# Patient Record
Sex: Female | Born: 1975 | Race: Black or African American | Hispanic: No | Marital: Single | Smoking: Never smoker
Health system: Southern US, Community
[De-identification: ages and names within clinical notes are randomized; demographics above are authoritative.]

## PROBLEM LIST (undated history)

## (undated) DIAGNOSIS — D219 Benign neoplasm of connective and other soft tissue, unspecified: Secondary | ICD-10-CM

## (undated) DIAGNOSIS — Z8619 Personal history of other infectious and parasitic diseases: Secondary | ICD-10-CM

## (undated) DIAGNOSIS — IMO0002 Reserved for concepts with insufficient information to code with codable children: Secondary | ICD-10-CM

## (undated) DIAGNOSIS — G932 Benign intracranial hypertension: Secondary | ICD-10-CM

## (undated) DIAGNOSIS — N959 Unspecified menopausal and perimenopausal disorder: Secondary | ICD-10-CM

## (undated) DIAGNOSIS — R87619 Unspecified abnormal cytological findings in specimens from cervix uteri: Secondary | ICD-10-CM

## (undated) DIAGNOSIS — K219 Gastro-esophageal reflux disease without esophagitis: Secondary | ICD-10-CM

## (undated) DIAGNOSIS — R55 Syncope and collapse: Secondary | ICD-10-CM

## (undated) DIAGNOSIS — D649 Anemia, unspecified: Secondary | ICD-10-CM

## (undated) HISTORY — DX: Unspecified menopausal and perimenopausal disorder: N95.9

## (undated) HISTORY — DX: Unspecified abnormal cytological findings in specimens from cervix uteri: R87.619

## (undated) HISTORY — DX: Personal history of other infectious and parasitic diseases: Z86.19

## (undated) HISTORY — DX: Syncope and collapse: R55

## (undated) HISTORY — DX: Reserved for concepts with insufficient information to code with codable children: IMO0002

## (undated) HISTORY — PX: TONSILLECTOMY: SUR1361

---

## 2001-10-14 ENCOUNTER — Other Ambulatory Visit: Admission: RE | Admit: 2001-10-14 | Discharge: 2001-10-14 | Payer: Self-pay | Admitting: Obstetrics and Gynecology

## 2002-02-18 HISTORY — PX: NASAL SEPTUM SURGERY: SHX37

## 2002-02-18 HISTORY — PX: TURBINATE RESECTION: SHX293

## 2002-10-21 ENCOUNTER — Other Ambulatory Visit: Admission: RE | Admit: 2002-10-21 | Discharge: 2002-10-21 | Payer: Self-pay | Admitting: Obstetrics and Gynecology

## 2003-02-19 HISTORY — PX: DILATION AND CURETTAGE OF UTERUS: SHX78

## 2004-04-29 ENCOUNTER — Ambulatory Visit: Payer: Self-pay | Admitting: Internal Medicine

## 2004-04-29 ENCOUNTER — Ambulatory Visit (HOSPITAL_BASED_OUTPATIENT_CLINIC_OR_DEPARTMENT_OTHER): Admission: RE | Admit: 2004-04-29 | Discharge: 2004-04-29 | Payer: Self-pay | Admitting: Otolaryngology

## 2004-06-20 ENCOUNTER — Ambulatory Visit (HOSPITAL_COMMUNITY): Admission: RE | Admit: 2004-06-20 | Discharge: 2004-06-21 | Payer: Self-pay | Admitting: Otolaryngology

## 2004-06-20 ENCOUNTER — Encounter (INDEPENDENT_AMBULATORY_CARE_PROVIDER_SITE_OTHER): Payer: Self-pay | Admitting: Specialist

## 2004-08-25 ENCOUNTER — Emergency Department (HOSPITAL_COMMUNITY): Admission: EM | Admit: 2004-08-25 | Discharge: 2004-08-25 | Payer: Self-pay | Admitting: Emergency Medicine

## 2004-09-16 ENCOUNTER — Emergency Department (HOSPITAL_COMMUNITY): Admission: EM | Admit: 2004-09-16 | Discharge: 2004-09-16 | Payer: Self-pay | Admitting: Family Medicine

## 2006-04-05 ENCOUNTER — Emergency Department (HOSPITAL_COMMUNITY): Admission: EM | Admit: 2006-04-05 | Discharge: 2006-04-05 | Payer: Self-pay | Admitting: Family Medicine

## 2006-08-13 ENCOUNTER — Emergency Department (HOSPITAL_COMMUNITY): Admission: EM | Admit: 2006-08-13 | Discharge: 2006-08-13 | Payer: Self-pay | Admitting: Emergency Medicine

## 2007-01-11 ENCOUNTER — Emergency Department (HOSPITAL_COMMUNITY): Admission: EM | Admit: 2007-01-11 | Discharge: 2007-01-11 | Payer: Self-pay | Admitting: Family Medicine

## 2007-09-02 ENCOUNTER — Emergency Department (HOSPITAL_COMMUNITY): Admission: EM | Admit: 2007-09-02 | Discharge: 2007-09-02 | Payer: Self-pay | Admitting: Emergency Medicine

## 2007-09-11 ENCOUNTER — Ambulatory Visit: Payer: Self-pay | Admitting: Internal Medicine

## 2007-09-11 DIAGNOSIS — R030 Elevated blood-pressure reading, without diagnosis of hypertension: Secondary | ICD-10-CM | POA: Insufficient documentation

## 2007-09-11 DIAGNOSIS — E282 Polycystic ovarian syndrome: Secondary | ICD-10-CM | POA: Insufficient documentation

## 2007-09-14 LAB — CONVERTED CEMR LAB
ALT: 16 units/L (ref 0–35)
Alkaline Phosphatase: 51 units/L (ref 39–117)
BUN: 12 mg/dL (ref 6–23)
Bilirubin, Direct: 0.1 mg/dL (ref 0.0–0.3)
Cholesterol: 146 mg/dL (ref 0–200)
Creatinine, Ser: 0.9 mg/dL (ref 0.4–1.2)
Eosinophils Absolute: 0.2 10*3/uL (ref 0.0–0.7)
Eosinophils Relative: 3.3 % (ref 0.0–5.0)
GFR calc non Af Amer: 78 mL/min
LDL Cholesterol: 93 mg/dL (ref 0–99)
Lymphocytes Relative: 40.1 % (ref 12.0–46.0)
MCHC: 32.1 g/dL (ref 30.0–36.0)
Monocytes Absolute: 0.4 10*3/uL (ref 0.1–1.0)
Monocytes Relative: 6.4 % (ref 3.0–12.0)
Platelets: 330 10*3/uL (ref 150–400)
Total Bilirubin: 0.6 mg/dL (ref 0.3–1.2)
Total CHOL/HDL Ratio: 3.8
Total Protein: 7.3 g/dL (ref 6.0–8.3)
VLDL: 15 mg/dL (ref 0–40)
WBC: 6.2 10*3/uL (ref 4.5–10.5)

## 2007-11-13 ENCOUNTER — Ambulatory Visit: Payer: Self-pay | Admitting: Internal Medicine

## 2007-11-13 DIAGNOSIS — N949 Unspecified condition associated with female genital organs and menstrual cycle: Secondary | ICD-10-CM

## 2007-11-13 DIAGNOSIS — R112 Nausea with vomiting, unspecified: Secondary | ICD-10-CM | POA: Insufficient documentation

## 2007-11-13 DIAGNOSIS — N938 Other specified abnormal uterine and vaginal bleeding: Secondary | ICD-10-CM | POA: Insufficient documentation

## 2007-11-13 DIAGNOSIS — N925 Other specified irregular menstruation: Secondary | ICD-10-CM | POA: Insufficient documentation

## 2007-11-13 LAB — CONVERTED CEMR LAB
Beta hcg, urine, semiquantitative: NEGATIVE
Ketones, urine, test strip: NEGATIVE
Nitrite: NEGATIVE
Specific Gravity, Urine: 1.015
WBC Urine, dipstick: NEGATIVE
pH: 6

## 2007-11-19 ENCOUNTER — Encounter: Admission: RE | Admit: 2007-11-19 | Discharge: 2007-11-19 | Payer: Self-pay | Admitting: Internal Medicine

## 2007-12-10 ENCOUNTER — Ambulatory Visit: Payer: Self-pay | Admitting: Internal Medicine

## 2008-02-19 ENCOUNTER — Emergency Department (HOSPITAL_COMMUNITY): Admission: EM | Admit: 2008-02-19 | Discharge: 2008-02-19 | Payer: Self-pay | Admitting: Family Medicine

## 2008-07-19 ENCOUNTER — Emergency Department (HOSPITAL_COMMUNITY): Admission: EM | Admit: 2008-07-19 | Discharge: 2008-07-19 | Payer: Self-pay | Admitting: Family Medicine

## 2008-10-07 ENCOUNTER — Ambulatory Visit: Payer: Self-pay | Admitting: Internal Medicine

## 2008-10-07 DIAGNOSIS — N926 Irregular menstruation, unspecified: Secondary | ICD-10-CM | POA: Insufficient documentation

## 2008-10-19 ENCOUNTER — Encounter: Payer: Self-pay | Admitting: Internal Medicine

## 2009-01-03 ENCOUNTER — Encounter (INDEPENDENT_AMBULATORY_CARE_PROVIDER_SITE_OTHER): Payer: Self-pay | Admitting: Obstetrics and Gynecology

## 2009-01-03 ENCOUNTER — Ambulatory Visit (HOSPITAL_COMMUNITY): Admission: RE | Admit: 2009-01-03 | Discharge: 2009-01-03 | Payer: Self-pay | Admitting: Obstetrics and Gynecology

## 2009-07-02 ENCOUNTER — Emergency Department (HOSPITAL_COMMUNITY): Admission: EM | Admit: 2009-07-02 | Discharge: 2009-07-02 | Payer: Self-pay | Admitting: Emergency Medicine

## 2010-05-23 LAB — CBC
Hemoglobin: 12.2 g/dL (ref 12.0–15.0)
MCHC: 31 g/dL (ref 30.0–36.0)
MCV: 71.4 fL — ABNORMAL LOW (ref 78.0–100.0)
Platelets: 320 10*3/uL (ref 150–400)
RBC: 5.49 MIL/uL — ABNORMAL HIGH (ref 3.87–5.11)
WBC: 6.8 10*3/uL (ref 4.0–10.5)

## 2010-05-23 LAB — PREGNANCY, URINE: Preg Test, Ur: NEGATIVE

## 2010-07-06 NOTE — H&P (Signed)
Kristin Harper, Kristin Harper              ACCOUNT NO.:  [id number]   MEDICAL RECORD NO.:  [id number]          PATIENT TYPE:  OIB   LOCATION:  2550                         FACILITY:  [facility]   PHYSICIAN:  Hermelinda Medicus, M.D.   DATE OF BIRTH:  1975-02-20   DATE OF ADMISSION:  06/20/2004  DATE OF DISCHARGE:                                HISTORY & PHYSICAL   This. Patient is a 35 year old female who has had difficulty breathing  through her nose.  She has severe nasal obstruction with turbinate  hypertrophy and a septal deviation.  She has also not had her tonsils out.  She has sleep apnea, snores and obstructs, and has frightened her family and  friends when she is sleeping.  Her ENT examination is quite unremarkable.  Her larynx is clear, except she does have a very small mouth and large  tonsils.  She also is overweight at 245.  She had a sleep study which showed  her deep sleep to be only 14% of the time.  More normal is 30%.  She has  severe obstructive sleep apnea/hypopnea syndrome, with an RDI of 54.9, and  an O2 saturation nadir of 77%.  She could consider CPAP, but she has such a  severe septal deviation and nasal obstruction so there would be highly  unlikely a positive result.  She therefore enters for a septal  reconstruction, turbinate reduction, a uvulopalatoplasty, and a  tonsillectomy to gain some more space within her nose and also within her  oral cavity.   PAST HISTORY:  1.  Allergy to PENICILLIN, rash.  2.  She does not smoke.  She does drink socially occasionally.  3.  She takes no medications.  4.  Has never had surgery.  5.  Has some borderline hypertension, but her blood pressure now is in good      condition at 135/86.  6.  She has contact lenses.  7.  She does have irregular periods.   PHYSICAL EXAMINATION:  GENERAL:  A 35 year old female weighing 268.  She is  66 inches tall.  VITAL SIGNS:  Blood pressure 135/86; pulse 88; temperature 99.7.  HEENT:  Ears are  clear.  Tympanic membrane are clear.  the nose shows very  strong, very large turbinates, and a septal deviation that is off to the  left primarily.  The oral cavity is very small.  The tonsils are 3+ in size.  The larynx was clear.  True cords, false cods, epiglottis, base of tongue  are clear of any ulceration or mass or abnormalities.  Larynx, true cord  mobility, gag reflex, __________, EOMs, facial nerve were all symmetrical.  NECK:  Free of any thyromegaly, cervical adenopathy, or mass, but is quite  full because of her increased weight.  CHEST:  Clear.  No rales, rhonchi, or wheezes.  CARDIOVASCULAR:  No murmurs, rubs, or gallops.  ABDOMEN:  Unremarkable.  Mildly obese.  EXTREMITIES:  Unremarkable.   INITIAL DIAGNOSES:  1.  Sleep apnea, with history of obesity.  2.  Septal deviation.  3.  Turbinate hypertrophy.      JC/MEDQ  D:  06/20/2004  T:  06/20/2004  Job:  [id number]   cc:   Fayrene Fearing A. Ashley Royalty, M.D.  [address]  [place], [place] [address]  Fax: [phone]

## 2010-07-06 NOTE — Procedures (Signed)
NAMEMELISSAANN, DIZDAREVIC              ACCOUNT NO.:  [id number]   MEDICAL RECORD NO.:  [id number]          PATIENT TYPE:  OUT   LOCATION:  SLEEP CENTER                 FACILITY:  [facility]   PHYSICIAN:  Clinton D. Maple Hudson, M.D. DATE OF BIRTH:  12-22-1975   DATE OF STUDY:  04/29/2004                              NOCTURNAL POLYSOMNOGRAM   REFERRING PHYSICIAN:  Dr. Hermelinda Medicus.   INDICATIONS FOR STUDY:  Hypersomnia with sleep apnea. Epworth sleepiness  score 10/24, BMI 39, weight 245 pounds.   SLEEP ARCHITECTURE:  Total sleep time 382 minutes with sleep efficiency of  95%. Stage I was 3%, stage II was 69%, stages III and IV were 15%, REM was  14% of total sleep time. Sleep latency was 5 minutes. REM latency 103  minutes. Awake after sleep onset 15 minutes. Arousal index 6.7. No sleep  medications were recorded.   RESPIRATORY DATA:  NPSG protocol. Respiratory disturbance index (RDI) 54.9  obstructive events per hour indicating severe obstructive sleep  apnea/hypopnea syndrome. There was 1 central apnea with 208 obstructive  apneas and 141 hypopneas. Events were not positional. REM RDI was 114.   OXYGEN DATA:  Moderate snoring with oxygen desaturation to a nadir of 77%.  Mean oxygen saturation through the study was 96% on room air.   CARDIAC DATA:  Normal sinus rhythm.   MOVEMENT/PARASOMNIA:  Occasional leg jerk.   IMPRESSION/RECOMMENDATIONS:  1.  Severe obstructive sleep apnea/hypopnea syndrome, RDI of 54.9 per hour      with moderate snoring and oxygen desaturation to a nadir of 77%.  2.  Consider CPAP titration or evaluate for alternative therapies as      appropriate.      CDY/MEDQ  D:  05/06/2004 14:16:03  T:  05/07/2004 11:46:24  Job:  [id number]

## 2010-07-06 NOTE — Op Note (Signed)
Kristin Harper, Kristin Harper              ACCOUNT NO.:  [id number]   MEDICAL RECORD NO.:  [id number]          PATIENT TYPE:  OIB   LOCATION:  2550                         FACILITY:  [facility]   PHYSICIAN:  Hermelinda Medicus, M.D.   DATE OF BIRTH:  05-14-1975   DATE OF PROCEDURE:  06/20/2004  DATE OF DISCHARGE:                                 OPERATIVE REPORT   PREOPERATIVE DIAGNOSES:  Sleep apnea with septal deviation and turbinate  hypertrophy with tonsillar hypertrophy with obesity, weighing 268.   POSTOPERATIVE DIAGNOSES:  Sleep apnea with septal deviation and turbinate  hypertrophy with tonsillar hypertrophy with obesity, weighing 268.   OPERATION:  Septal reconstruction, turbinate reduction with tonsillectomy  and uvulopalatoplasty.   SURGEON:  Hermelinda Medicus, M.D.   ANESTHESIA:  General endotracheal with Dr. Noreene Larsson.   DESCRIPTION OF PROCEDURE:  The patient was placed in the supine position and  under general orotracheal anesthesia, the patient was prepped and draped in  the appropriate manner using the usual head drape and the nose was  anesthetized using 1% Xylocaine with epinephrine and topical cocaine 200 mg.  The inferior turbinates were aggressively outfractured and also the  __________ bipolar cautery was used to shrink the mucous membranes of these  very large obstructive turbinates.  The septum was then approached using a  hemitransfixion incision on the right, carrying it around the columella to  the left and then back along the quadrilateral cartilage and ethmoid septum  and this was taken down using the [place] and then the [place] and opening  and close [employer] forceps.  Once this was brought down, then the  boomerang septal deviation was also corrected using the 4-mm chisel.  Hemostasis was established and then closure of the septum was with 5-0 plain  catgut and through-and-through septal suture as a blanket stitch using 4-0  x2.  Telfa was used on a temporary  basis in the nose.   Attention was then carried to the oral cavity where the tonsillar Davis  mouth gag was placed.  The tonsils were found to be large and were removed  using blunt dissection and Bovie electrocoagulation for hemostasis.  Once  the tonsils were removed, the uvula was trimmed which was also twice as  large as the normal size and the palate was also trimmed to make it a little  bit higher on the contour in this very small mouth.  Once this was completed  and all hemostasis was again checked, the stomach was suctioned.  The gag  was slowly released.  The tonsillar beds were checked for any lack of  hemostasis and found to be completely dry.  The nasopharynx and nose were  suctioned and  then the Telfa was removed and replaced with anesthesia trumpets to  guarantee immediate postop airway.  The patient was taken to the recovery  room in good condition.   Followup will be in step-down 3300 and then in 1 week, 2 weeks, 3 weeks,  then 6 weeks, 3 months, and 6 months.      JC/MEDQ  D:  06/20/2004  T:  06/20/2004  Job:  [id number]   cc:   Rudy Jew. Ashley Royalty, M.D.  [address]  [place], [place] [address]  Fax: [phone]

## 2010-09-21 ENCOUNTER — Inpatient Hospital Stay (INDEPENDENT_AMBULATORY_CARE_PROVIDER_SITE_OTHER)
Admission: RE | Admit: 2010-09-21 | Discharge: 2010-09-21 | Disposition: A | Discharge status: Home / Self Care | Payer: BC Managed Care – PPO | Source: Ambulatory Visit | Attending: Emergency Medicine | Admitting: Emergency Medicine

## 2010-09-21 DIAGNOSIS — J019 Acute sinusitis, unspecified: Secondary | ICD-10-CM

## 2010-09-21 DIAGNOSIS — J40 Bronchitis, not specified as acute or chronic: Secondary | ICD-10-CM

## 2011-05-06 ENCOUNTER — Emergency Department (HOSPITAL_COMMUNITY)
Admission: EM | Admit: 2011-05-06 | Discharge: 2011-05-06 | Disposition: A | Discharge status: Home / Self Care | Payer: BC Managed Care – PPO | Source: Home / Self Care | Attending: Emergency Medicine | Admitting: Emergency Medicine

## 2011-05-06 ENCOUNTER — Encounter (HOSPITAL_COMMUNITY): Payer: Self-pay

## 2011-05-06 DIAGNOSIS — R51 Headache: Secondary | ICD-10-CM

## 2011-05-06 DIAGNOSIS — R519 Headache, unspecified: Secondary | ICD-10-CM

## 2011-05-06 MED ORDER — AZITHROMYCIN 250 MG PO TABS: 250.0000 mg | ORAL_TABLET | Freq: Every day | ORAL | Status: AC

## 2011-05-06 MED ORDER — CETIRIZINE-PSEUDOEPHEDRINE ER 5-120 MG PO TB12: 1.0000 | ORAL_TABLET | Freq: Every day | ORAL | Status: DC

## 2011-05-06 NOTE — Discharge Instructions (Signed)
As discussed start with the congestion along with Zyrtec and if no improvement within the next 2-3 days or more times pressures perceive start with provided antibiotic. Resting well hydrated and use a humidifier as discussed the believe your symptoms are related to sinus congestion but he would be unlikely at this point to be a  bacterial process

## 2011-05-06 NOTE — ED Provider Notes (Signed)
History     CSN: [id number]  Arrival date & time 05/06/11  1941   First MD Initiated Contact with Patient 05/06/11 1952      Chief Complaint  Patient presents with  . Facial Pain    (Consider location/radiation/quality/duration/timing/severity/associated sxs/prior treatment) HPI Comments: Patient presents with a headache (points to both frontal and maxillary areas). Pressure type sensation exacerbates with leaning forward also gets a little bit dizzy she moves her head. Described that during which she might have been a bit congestion of her nose and she was also having some diarrhea and vomiting which have resolved at this point. Patient denies any discomfort or pain any fevers. Have been taking Motrin with significant relief to the point that this moment she barely perceives any pressure.  Patient denies any numbness, weakness, visual disturbances  Patient is a 36 y.o. female presenting with headaches.  Headache The primary symptoms include headaches, dizziness, nausea and vomiting. Primary symptoms do not include loss of consciousness, altered mental status, visual change, focal weakness, loss of sensation or fever. The symptoms began 2 days ago. The symptoms are unchanged. The neurological symptoms are multifocal.  The headache is not associated with photophobia, visual change, neck stiffness or weakness.  Dizziness also occurs with nausea and vomiting. Dizziness does not occur with weakness.  Additional symptoms do not include neck stiffness, weakness, photophobia or irritability.    History reviewed. No pertinent past medical history.  History reviewed. No pertinent past surgical history.  History reviewed. No pertinent family history.  History  Substance Use Topics  . Smoking status: Not on file  . Smokeless tobacco: Not on file  . Alcohol Use: Not on file    OB History    Grav Para Term Preterm Abortions TAB SAB Ect Mult Living                  Review of Systems    Constitutional: Positive for chills. Negative for fever, activity change and irritability.  HENT: Positive for congestion and postnasal drip. Negative for neck pain and neck stiffness.   Eyes: Negative for photophobia.  Gastrointestinal: Positive for nausea, vomiting and diarrhea. Negative for abdominal pain.  Skin: Positive for color change.  Neurological: Positive for dizziness and headaches. Negative for tremors, focal weakness, loss of consciousness, speech difficulty, weakness, light-headedness and numbness.  Psychiatric/Behavioral: Negative for altered mental status.    Allergies  Penicillins  Home Medications   Current Outpatient Rx  Name Route Sig Dispense Refill  . CETIRIZINE HCL 10 MG PO TABS Oral Take 10 mg by mouth daily.    . AZITHROMYCIN 250 MG PO TABS Oral Take 1 tablet (250 mg total) by mouth daily. Take first 2 tablets together, then 1 every day until finished. 6 tablet 0  . CETIRIZINE-PSEUDOEPHEDRINE ER 5-120 MG PO TB12 Oral Take 1 tablet by mouth daily. 10 tablet 0    BP 132/91  Pulse 78  Temp(Src) 99.4 F (37.4 C) (Oral)  Resp 16  SpO2 99%  LMP 04/08/2011  Physical Exam  Nursing note and vitals reviewed. Constitutional: She appears well-developed and well-nourished.  HENT:  Head: Normocephalic.  Right Ear: Tympanic membrane normal.  Left Ear: Tympanic membrane normal.  Mouth/Throat: Uvula is midline, oropharynx is clear and moist and mucous membranes are normal. No oropharyngeal exudate.  Eyes: Conjunctivae are normal.  Neck: Neck supple. No JVD present.  Cardiovascular: Normal rate.  Exam reveals no gallop.   No murmur heard. Pulmonary/Chest: Effort normal and breath sounds  normal. No respiratory distress. She has no decreased breath sounds. She has no rhonchi. She has no rales.  Abdominal: Bowel sounds are normal. She exhibits no distension. There is no tenderness. There is no rebound.  Lymphadenopathy:    She has no cervical adenopathy.  Skin:  Skin is warm. No erythema.    ED Course  Procedures (including critical care time)  Labs Reviewed - No data to display No results found.   1. Sinus headache       MDM  Sinus congestion with resolving gastrointestinal symptoms and her symptomatic management for the next 2-3 days patient agree with treatment plan        Jimmie Molly, MD 05/06/11 2052

## 2011-05-06 NOTE — ED Notes (Signed)
C/o pressure in face, feels off kilter; concerned about poss sinus infection

## 2011-07-13 ENCOUNTER — Inpatient Hospital Stay (HOSPITAL_COMMUNITY)
Admission: AD | Admit: 2011-07-13 | Discharge: 2011-07-13 | Disposition: A | Discharge status: Home / Self Care | Payer: BC Managed Care – PPO | Source: Ambulatory Visit | Attending: Obstetrics & Gynecology | Admitting: Obstetrics & Gynecology

## 2011-07-13 ENCOUNTER — Encounter (HOSPITAL_COMMUNITY): Payer: Self-pay | Admitting: Obstetrics and Gynecology

## 2011-07-13 DIAGNOSIS — Z711 Person with feared health complaint in whom no diagnosis is made: Secondary | ICD-10-CM | POA: Insufficient documentation

## 2011-07-13 DIAGNOSIS — Z01419 Encounter for gynecological examination (general) (routine) without abnormal findings: Secondary | ICD-10-CM

## 2011-07-13 NOTE — MAU Provider Note (Signed)
Kristin Harper [id number]  No chief complaint on file.    First Provider Initiated Contact with Patient 07/13/11 1851      SUBJECTIVE  HPI: Pt presents during her menses with retained tampon.  She placed a light flow tampon this morning and a few hours later could not find the string.  She tried searching for the tampon herself but could not find it, then went to Urgent care and had a pelvic exam.  No tampon was found by Urgent care provider so pt sent to MAU for eval. She denies vaginal itching/burning, urinary symptoms, h/a, dizziness, n/v, or fever/chills.     History reviewed. No pertinent past medical history. Past Surgical History  Procedure Date  . Dilation and curettage of uterus 2005    In [place]  . Tonsillectomy   . Turbinate resection 2004  . Nasal septum surgery 2004   History   Social History  . Marital Status: Single    Spouse Name: N/A    Number of Children: N/A  . Years of Education: N/A   Occupational History  . Not on file.   Social History Main Topics  . Smoking status: Never Smoker   . Smokeless tobacco: Not on file  . Alcohol Use: Yes     socially  . Drug Use: No  . Sexually Active: Yes    Birth Control/ Protection: Condom   Other Topics Concern  . Not on file   Social History Narrative  . No narrative on file   No current facility-administered medications on file prior to encounter.   Current Outpatient Prescriptions on File Prior to Encounter  Medication Sig Dispense Refill  . cetirizine (ZYRTEC) 10 MG tablet Take 10 mg by mouth daily.      . cetirizine-pseudoephedrine (ZYRTEC-D) 5-120 MG per tablet Take 1 tablet by mouth daily.  10 tablet  0   Allergies  Allergen Reactions  . Penicillins Hives and Rash    ROS: Pertinent items in HPI  OBJECTIVE Blood pressure 139/95, pulse 103, temperature 97.5 F (36.4 C), temperature source Oral, resp. rate 18, height 5\' 7"  (1.702 m), weight 133.993 kg (295 lb 6.4 oz), last menstrual period  07/08/2011.  GENERAL: Well-developed, well-nourished female in no acute distress.  HEENT: Normocephalic, good dentition HEART: normal rate RESP: normal effort ABDOMEN: Soft, nontender EXTREMITIES: Nontender, no edema NEURO: Alert and oriented Pelvic exam: Cervix pink, visually closed, without lesion, small amount dark red blood from cervical os and in vaginal vault, Tampon not visualized, vaginal walls and external genitalia normal Bimanual exam: Cervix 0/long/high, firm, posterior, neg CMT, No tampon palpable in vagina  ASSESSMENT Normal pelvic exam No retained tampon  PLAN D/C home Reassurance provided that tampon is not in vagina and must have come out during bowel movement or some other time At pt request, discussed signs of infection for retained tampon and reasons pt should return Return to MAU as needed     LEFTWICH-KIRBY, Zarius Furr 07/13/2011 7:01 PM

## 2011-07-13 NOTE — Discharge Instructions (Signed)
No tampon was found retained in the vagina during pelvic exam today.  However, if you have signs of infection, including fever/chills, nausea or vomiting, rash, severe h/a, etc, please return to MAU.    Menstruation Menstruation is the monthly passing of blood, tissue, fluid and mucus, also know as a period. Your body is shedding the lining of the uterus. The flow, or amount of blood, usually lasts from 3 to 7 days each month. Hormones control the menstrual cycle. Hormones are a chemical substance produced by endocrine glands in the body to regulate different bodily functions. The first menstrual period may start any time between age 32 to 70 years. However, it usually starts around age 91 or 65. Some girls have regular monthly menstrual cycles right from the beginning. However, it is not unusual to have only a couple of drops of blood or spotting when you first start menstruating. It is also not unusual to have two periods a month or miss a month or two when first starting your periods. SYMPTOMS   Mild to moderate abdominal cramps.   Aching or pain in the lower back area.  Symptoms that may occur 5 to 10 days before your menstrual period starts, which is referred to as premenstrual syndrome (PMS). These symptoms can include:  Headache.   Breast tenderness and swelling.   Bloating.   Tiredness (fatigue).   Mood changes.   Craving for certain foods.  These are normal signs and symptoms and can vary in severity. To help relieve these problems, ask your caregiver if you can take over-the-counter medications for pain or discomfort. If the symptoms are not controllable, see your caregiver for help.  HORMONES INVOLVED IN MENSTRUATION Menstruation comes about because of hormones produced by the pituitary gland in the brain and the ovaries that affect the uterine lining. First, the pituitary gland in the brain produces the hormone Follicle Stimulating Hormone [facility]). FSH stimulates the ovaries to  produce estrogen, which thickens the uterine lining and begins to develop an egg in the ovary. About 14 days later, the pituitary gland produces another hormone called Luteinizing Hormone (LH). LH causes the egg to come out of a sac in the ovary (ovulation). The empty sac on the ovary called the corpus luteum is stimulated by another hormone from the pituitary gland called luteotropin. The corpus luteum begins to produce the estrogen and progesterone hormone. The progesterone hormone prepares the lining of the uterus to have the fertilized egg (egg and sperm) attach to the lining of the uterus and begin to develop into a fetus. If the egg is not fertilized, the corpus luteum stops producing estrogen and progesterone, it disappears, the lining of the uterus sloughs off and a menstrual period begins. Then the menstrual cycle starts all over again and will continue monthly unless pregnancy occurs or menopause begins. The secretion of hormones is complex. Various parts of the body become involved in many chemical activities. Female sex hormones have other functions in a woman's body as well. Estrogen increases a woman's sex drive (libido). It naturally helps body get rid of fluids (diuretic). It also aids in the process of building new bone. Therefore, maintaining hormonal health is essential to all levels of a woman's well being. These hormones are usually present in normal amounts and cause you to menstruate. It is the relationship between the (small) levels of the hormones that is critical. When the balance is upset, menstrual irregularities can occur. HOW DOES THE MENSTRUAL CYCLE HAPPEN?  Menstrual cycles  vary in length from 21 to 35 days with an average of 29 days. The cycle begins on the first day of bleeding. At this time, the pituitary gland in the brain releases FSH that travels through the bloodstream to the ovaries. [facility] stimulates the follicles in the ovaries. This prepares the body for ovulation that  occurs around the 14th day of the cycle. The ovaries produce estrogen, and this makes sure conditions are right in the uterus for implantation of the fertilized egg.   When the levels of estrogen reach a high enough level, it signals the gland in the brain (pituitary gland) to release a surge of LH. This causes the release of the ripest egg from its follicle (ovulation). Usually only one follicle releases one egg, but sometimes more than one follicle releases an egg especially when stimulating the ovaries for invitro fertilization. The egg can then be collected by either fallopian tube to await fertilization. The burst follicle within the ovary that is left behind is now called the corpus luteum or "yellow body." The corpus luteum continues to give off (secrete) reduced amounts of estrogen. This closes and hardens the cervix. It driesup the mucus to the naturally infertile condition.   The corpus luteum also begins to give off greater amounts of progesterone. This causes the lining of the uterus (endometrium) to thicken even more in preparation for the fertilized egg. The egg is starting to journey down from the fallopian tube to the uterus. It also signals the ovaries to stop releasing eggs. It assists in returning the cervical mucus to its infertile state.   If the egg implants successfully into the womb lining and pregnancy occurs, progesterone levels will continue to raise. It is often this hormone that gives some pregnant women a feeling of well being, like a "natural high." Progesterone levels drop again after childbirth.   If fertilization does not occur, the corpus luteum dies, stopping the production of hormones. This sudden drop in progesterone causes the uterine lining to break down, accompanied by blood (menstruation).   This starts the cycle back at day 1. The whole process starts all over again. Woman go through this cycle every month from puberty to menopause. Women have breaks only for  pregnancy and breastfeeding (lactation), unless the woman has health problems that affect the female hormone system or chooses to use oral contraceptives to have unnatural menstrual periods.  HOME CARE INSTRUCTIONS   Keep track of your periods by using a calendar.   If you use tampons, get the least absorbent to avoid toxic shock syndrome.   Do not leave tampons in the vagina over night or longer than 6 hours.   Wear a sanitary pad over night.   Exercise 3 to 5 times a week or more.   Avoid foods and drinks that you know will make your symptoms worse before or during your period.  SEEK MEDICAL CARE IF:   You develop a fever of 100 F (37.8 C) or higher with your period.   Your periods are lasting more than 7 days.   Your period is so heavy that you have to change pads or tampons every 30 minutes.   You develop clots with your period and never had clots before.   You cannot get relief from over-the-counter medication for your symptoms.   Your period has not started, and it has been longer than 35 days.  Document Released: 01/25/2002 Document Revised: 01/24/2011 Document Reviewed: 11/20/2007 [facility] Patient Information 2012 [place]. 

## 2011-07-13 NOTE — MAU Note (Signed)
Inserted tampon a little bit after 11:00 tried to get it out went to [facility]

## 2011-07-13 NOTE — MAU Note (Signed)
"  I put in a tampon a little bit after 11 this morning.  I tried (for about) to get it out with some tweezers.  I then went to an Urgent Care to have the NP get it out and she was unable to get it out.  She told me to come here."

## 2012-01-07 ENCOUNTER — Encounter: Payer: Self-pay | Admitting: Internal Medicine

## 2012-01-07 ENCOUNTER — Ambulatory Visit (INDEPENDENT_AMBULATORY_CARE_PROVIDER_SITE_OTHER): Payer: BC Managed Care – PPO | Admitting: Internal Medicine

## 2012-01-07 VITALS — BP 124/86 | HR 81 | Temp 98.1°F | Wt 286.0 lb

## 2012-01-07 DIAGNOSIS — R3915 Urgency of urination: Secondary | ICD-10-CM

## 2012-01-07 DIAGNOSIS — Z87440 Personal history of urinary (tract) infections: Secondary | ICD-10-CM

## 2012-01-07 DIAGNOSIS — R350 Frequency of micturition: Secondary | ICD-10-CM

## 2012-01-07 LAB — POCT URINALYSIS DIPSTICK
Bilirubin, UA: NEGATIVE
Glucose, UA: NEGATIVE
Ketones, UA: NEGATIVE
Nitrite, UA: NEGATIVE
Protein, UA: NEGATIVE
Spec Grav, UA: 1.015

## 2012-01-07 LAB — POCT URINE PREGNANCY: Preg Test, Ur: NEGATIVE

## 2012-01-07 MED ORDER — NITROFURANTOIN MONOHYD MACRO 100 MG PO CAPS: 100.0000 mg | ORAL_CAPSULE | Freq: Two times a day (BID) | ORAL | Status: DC

## 2012-01-07 NOTE — Patient Instructions (Addendum)
Uncertain if this could be partly treated UTI   Urinary sx are very common .   Will notify you  of labs when available. Different antibiotic for now .    Urinary Frequency The number of times a normal person urinates depends upon how much liquid they take in and how much liquid they are losing. If the temperature is hot and there is high humidity then the person will sweat more and usually breathe a little more frequently. These factors decrease the amount of frequency of urination that would be considered normal. The amount you drink is easily determined, but the amount of fluid lost is sometimes more difficult to calculate.  Fluid is lost in two ways:  Sensible fluid loss is usually measured by the amount of urine that you get rid of. Losses of fluid can also occur with diarrhea.  Insensible fluid loss is more difficult to measure. It is caused by evaporation. Insensible loss of fluid occurs through breathing and sweating. It usually ranges from a little less than a quart to a little more than a quart of fluid a day. In normal temperatures and activity levels the average person may urinate 4 to 7 times in a 24-hour period. Needing to urinate more often than that could indicate a problem. If one urinates 4 to 7 times in 24 hours and has large volumes each time, that could indicate a different problem from one who urinates 4 to 7 times a day and has small volumes. The time of urinating is also an important. Most urinating should be done during the waking hours. Getting up at night to urinate frequently can indicate some problems. CAUSES  The bladder is the organ in your lower abdomen that holds urine. Like a balloon, it swells some as it fills up. Your nerves sense this and tell you it is time to head for the bathroom. There are a number of reasons that you might feel the need to urinate more often than usual. They include:  Urinary tract infection. This is usually associated with other signs such as  burning when you urinate.  In men, problems with the prostate (a walnut-size gland that is located near the tube that carries urine out of your body). There are two reasons why the prostate can cause an increased frequency of urination:  An enlarged prostate that does not let the bladder empty well. If the bladder only half empties when you urinate then it only has half the capacity to fill before you have to urinate again.  The nerves in the bladder become more hypersensitive with an increased size of the prostate even if the bladder empties completely.  Pregnancy.  Obesity. Excess weight is more likely to cause a problem for women more than for men.  Bladder stones or other bladder problems.  Caffeine.  Alcohol.  Medications. For example, drugs that help the body get rid of extra fluid (diuretics) increase urine production. Some other medicines must be taken with lots of fluids.  Muscle or nerve weakness. This might be the result of a spinal cord injury, a stroke, multiple sclerosis or Parkinson's disease.  Long-standing diabetes can decrease the sensation of the bladder. This loss of sensation makes it harder to sense the bladder needs to be emptied. Over a period of years the bladder is stretched out by constant overfilling. This weakens the bladder muscles so that the bladder does not empty well and has less capacity to fill with new urine.  Interstitial cystitis (  also called painful bladder syndrome). This condition develops because the tissues that line the insider of the bladder are inflamed (inflammation is the body's way of reacting to injury or infection). It causes pain and frequent urination. It occurs in women more often than in men. DIAGNOSIS   To decide what might be causing your urinary frequency, your healthcare provider will probably:  Ask about symptoms you have noticed.  Ask about your overall health. This will include questions about any medications you are  taking.  Do a physical examination.  Order some tests. These might include:  A blood test to check for diabetes or other health issues that could be contributing to the problem.  Urine testing. This could measure the flow of urine and the pressure on the bladder.  A test of your neurological system (the brain, spinal cord and nerves). This is the system that senses the need to urinate.  A bladder test to check whether it is emptying completely when you urinate.  Cytoscopy. This test uses a thin tube with a tiny camera on it. It offers a look inside your urethra and bladder to see if there are problems.  Imaging tests. You might be given a contrast dye and then asked to urinate. X-rays are taken to see how your bladder is working. TREATMENT  It is important for you to be evaluated to determine if the amount or frequency that you have is unusual or abnormal. If it is found to be abnormal the cause should be determined and this can usually be found out easily. Depending upon the cause treatment could include medication, stimulation of the nerves, or surgery. There are not too many things that you can do as an individual to change your urinary frequency. It is important that you balance the amount of fluid intake needed to compensate for your activity and the temperature. Medical problems will be diagnosed and taken care of by your physician. There is no particular bladder training such as Kegel's exercises that you can do to help urinary frequency. This is an exercise this is usually done for people who have leaking of urine when they laugh cough or sneeze. HOME CARE INSTRUCTIONS   Take any medications your healthcare provider prescribed or suggested. Follow the directions carefully.  Practice any lifestyle changes that are recommended. These might include:  Drinking less fluid or drinking at different times of the day. If you need to urinate often during the night, for example, you may need  to stop drinking fluids early in the evening.  Cutting down on caffeine or alcohol. They both can make you need to urinate more often than normal. Caffeine is found in coffee, tea and sodas.  Losing weight, if that is recommended.  Keep a journal or a log. You might be asked to record how much you drink and when and when you feel the need to urinate. This will also help evaluate how well the treatment provided by your physician is working. SEEK MEDICAL CARE IF:   Your need to urinate often gets worse.  You feel increased pain or irritation when you urinate.  You notice blood in your urine.  You have questions about any medications that your healthcare provider recommended.  You notice blood, pus or swelling at the site of any test or treatment procedure.  You develop a fever of more than 100.5 F (38.1 C). SEEK IMMEDIATE MEDICAL CARE IF:  You develop a fever of more than 102.0 F (38.9 C). Document  Released: 12/01/2008 Document Revised: 04/29/2011 Document Reviewed: 12/01/2008 [facility] Patient Information 2013 [place], [place].

## 2012-01-07 NOTE — Progress Notes (Signed)
Patient comes in today for SDA for  new problem evaluation.  Seen urgent care  In Nov 9th  And went to urgent care and had positive for leukocytes and so went to urgent care and it was negative at that time and was given cipro  For 3 days and now coming back.  Had dysuria and frequency .  Having every hours  Had urinary urgency feeling " something not right ."   Had had problems for a few weeks to one month ago. And tried cranberry  pills .  No fever  Or pain   No  vaginal sx and had period last week. Dr Pennie Rushing is primary gyne nl periods.  More regular.   Didn't do pregnancy test.  Uses condoms.  ROS no rash ulcers abs pain bowel habit change at this time.   History   Social History  . Marital Status: Single    Spouse Name: N/A    Number of Children: N/A  . Years of Education: N/A   Occupational History  . Not on file.   Social History Main Topics  . Smoking status: Never Smoker   . Smokeless tobacco: Not on file  . Alcohol Use: Yes     Comment: socially  . Drug Use: No  . Sexually Active: Yes    Birth Control/ Protection: Condom   Other Topics Concern  . 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 Dilation and curettage of uterus 2005    In [place]  . Tonsillectomy   . Turbinate resection 2004  . Nasal septum surgery 2004    Family History  Problem Relation Age of Onset  . Hypertension Mother   . Other Father     shot and killed age 73    Allergies  Allergen Reactions  . Penicillins Hives and Rash    Current Outpatient Prescriptions on File Prior to Visit  Medication Sig Dispense Refill  . cetirizine (ZYRTEC) 10 MG tablet Take 10 mg by mouth daily.        BP 124/86  Pulse 81  Temp 98.1 F (36.7 C) (Oral)  Wt 286 lb (129.729 kg)  SpO2 99%  LMP 12/30/2011  WDWN in nad  Neck: Supple without adenopathy or masses or bruits Chest:  Clear to A&P without wheezes rales or  rhonchi CV:  S1-S2 no gallops or murmurs peripheral perfusion is normal Abdomen:  Sof,t normal bowel sounds without hepatosplenomegaly, no guarding rebound or masses no CVA tenderness  1. Urinary frequency  POC Urinalysis Dipstick, Chlamydia probe amplification, urine, Urine culture, Urine culture, POCT urine pregnancy, Urine culture   recent uti rx elsewhere recurrent sx  suppose part treated uti empiric rx   fu with gyne or [place] if persistent  2. Urgency of urination  POC Urinalysis Dipstick, Chlamydia probe amplification, urine, Urine culture, Urine culture, POCT urine pregnancy, Urine culture  3. History of recurrent UTIs

## 2012-01-09 ENCOUNTER — Encounter: Payer: Self-pay | Admitting: Internal Medicine

## 2012-01-09 DIAGNOSIS — Z87440 Personal history of urinary (tract) infections: Secondary | ICD-10-CM | POA: Insufficient documentation

## 2012-01-10 ENCOUNTER — Encounter: Payer: Self-pay | Admitting: Family Medicine

## 2012-01-13 ENCOUNTER — Telehealth: Payer: Self-pay | Admitting: Obstetrics and Gynecology

## 2012-01-13 NOTE — Telephone Encounter (Signed)
Tc to pt. Scheduled with Dr Maryellen Pile as New GYn 01/22/12.  Pt to contact PCP for F/U until then. Pt agreeable.

## 2012-01-13 NOTE — Telephone Encounter (Signed)
TC to pt. States is having urinary frequency and urgency x 1 month.  Has been treated with antibiotics at [facility] and PCP .  Sx persists. Was advised to have GYN eval if continues. Pt last seen 01/2009.

## 2012-01-22 ENCOUNTER — Ambulatory Visit (INDEPENDENT_AMBULATORY_CARE_PROVIDER_SITE_OTHER): Payer: BC Managed Care – PPO | Admitting: Obstetrics and Gynecology

## 2012-01-22 ENCOUNTER — Encounter: Payer: Self-pay | Admitting: Obstetrics and Gynecology

## 2012-01-22 VITALS — BP 110/78 | HR 76 | Resp 16 | Ht 67.0 in | Wt 291.0 lb

## 2012-01-22 DIAGNOSIS — Z124 Encounter for screening for malignant neoplasm of cervix: Secondary | ICD-10-CM

## 2012-01-22 DIAGNOSIS — N938 Other specified abnormal uterine and vaginal bleeding: Secondary | ICD-10-CM

## 2012-01-22 DIAGNOSIS — R350 Frequency of micturition: Secondary | ICD-10-CM | POA: Insufficient documentation

## 2012-01-22 DIAGNOSIS — N852 Hypertrophy of uterus: Secondary | ICD-10-CM

## 2012-01-22 DIAGNOSIS — N949 Unspecified condition associated with female genital organs and menstrual cycle: Secondary | ICD-10-CM

## 2012-01-22 LAB — POCT URINALYSIS DIPSTICK
Blood, UA: NEGATIVE
Ketones, UA: NEGATIVE
Nitrite, UA: NEGATIVE
Protein, UA: NEGATIVE
Urobilinogen, UA: NEGATIVE
pH, UA: 6

## 2012-01-22 NOTE — Progress Notes (Signed)
ANNUAL:  Last Pap: 01/03/2009 WNL: Yes.  Has hx abnl pap in 2004 treated with cryosurgery. Regular Periods:yes "pt states they are regulated now" Had hx of irreg menses in past Contraception: Condoms  Monthly Breast exam:no Tetanus<62yrs:no Nl.Bladder Function:yes "Frequency has gotten better" Daily BMs:yes Healthy Diet:yes Calcium:no Mammogram:no Date of Mammogram: N/A Exercise:yes Have often Exercise: Elliptical once- twice a week Seatbelt: yes Abuse at home: no Stressful work:no Sigmoid-colonoscopy: Never per pt  Bone Density: No PCP: Dr.Wanda Panosh Change in PMH: No Changes Change in FMH:No Changes  Subjective:    Kristin Harper is a 36 y.o. female, G1P0010, who presents for an annual exam. Had an episode of urinary frequency lasting for about a month, starting after intercourse with negative workup for UTI.  Now resolved    History   Social History  . Marital Status: Single    Spouse Name: N/A    Number of Children: N/A  . Years of Education: N/A   Social History Main Topics  . Smoking status: Never Smoker   . Smokeless tobacco: Never Used  . Alcohol Use: Yes     Comment: socially  . Drug Use: No  . Sexually Active: Yes -- Female partner(s)    Birth Control/ Protection: Condom   Other Topics Concern  . 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           CycleMonthly for 5-7 days.  No IM bleeding No cramps  The following portions of the patient's history were reviewed and updated as appropriate: allergies, current medications, past family history, past medical history, past social history, past surgical history and problem list.  Review of Systems Pertinent items are noted in HPI. Breast:Negative for breast lump,nipple discharge or nipple retraction Gastrointestinal: Negative for abdominal pain, change in bowel habits or rectal  bleeding Urinary:negative   Objective:    BP 110/78  Ht 5\' 7"  (1.702 m)  Wt 291 lb (131.997 kg)  BMI 45.58 kg/m2  LMP 12/30/2011    Weight:  Wt Readings from Last 1 Encounters:  01/22/12 291 lb (131.997 kg)          BMI: Body mass index is 45.58 kg/(m^2).  General Appearance: Alert, appropriate appearance for age. No acute distress HEENT: Grossly normal Neck / Thyroid: Supple, no masses, nodes or enlargement Lungs: clear to auscultation bilaterally Back: No CVA tenderness Breast Exam: No masses or nodes.No dimpling, nipple retraction or discharge. Cardiovascular: Regular rate and rhythm. S1, S2, no murmur Gastrointestinal: Soft, non-tender, no masses or organomegaly Pelvic Exam: External genitalia: normal general appearance Vaginal: normal rugae Cervix: normal appearance Adnexa: No masses Uterus: feels enlarged to 8-10 weeks size Exam limited by body habitus Rectovaginal: normal rectal, no masses Lymphatic Exam: Non-palpable nodes in neck, clavicular, axillary, or inguinal regions Skin: no rash or abnormalities Neurologic: Normal gait and speech, no tremor  Psychiatric: Alert and oriented, appropriate affect.   Wet Prep:not applicable Urinalysis:not applicable UPT: Not done   Assessment:    Urinary frequency resolved, question started as postcoital bacteriuria  Possible uterine enlargement r/o fibroids Hx abnl pap, s/p cryo 2004   Plan:    pap smear ultrasound STD screening: declined done by Dr Fabian Sharp .Contraception:condoms  Satisfied with method   [name] MD

## 2012-01-23 LAB — PAP IG W/ RFLX HPV ASCU

## 2012-03-03 ENCOUNTER — Other Ambulatory Visit: Payer: Self-pay | Admitting: Obstetrics and Gynecology

## 2012-03-03 DIAGNOSIS — N852 Hypertrophy of uterus: Secondary | ICD-10-CM

## 2012-03-04 ENCOUNTER — Ambulatory Visit: Payer: BC Managed Care – PPO

## 2012-03-04 ENCOUNTER — Ambulatory Visit: Payer: BC Managed Care – PPO | Admitting: Obstetrics and Gynecology

## 2012-03-04 ENCOUNTER — Encounter: Payer: Self-pay | Admitting: Obstetrics and Gynecology

## 2012-03-04 VITALS — BP 124/86 | Ht 66.25 in | Wt 288.0 lb

## 2012-03-04 DIAGNOSIS — N949 Unspecified condition associated with female genital organs and menstrual cycle: Secondary | ICD-10-CM

## 2012-03-04 DIAGNOSIS — E282 Polycystic ovarian syndrome: Secondary | ICD-10-CM

## 2012-03-04 DIAGNOSIS — N938 Other specified abnormal uterine and vaginal bleeding: Secondary | ICD-10-CM

## 2012-03-04 DIAGNOSIS — N926 Irregular menstruation, unspecified: Secondary | ICD-10-CM

## 2012-03-04 DIAGNOSIS — N852 Hypertrophy of uterus: Secondary | ICD-10-CM

## 2012-03-04 NOTE — Progress Notes (Signed)
GYN PROBLEM VISIT  [name] is a 37 y.o. year old female,G1P0010, who presents for followup  Subjective:  Pt here for follow up for ultrasound to r/o uterine enlargment  Objective:  BP 124/86  Ht 5' 6.25" (1.683 m)  Wt 288 lb (130.636 kg)  BMI 46.13 kg/m2  LMP 03/03/2012   ULTRASOUND: Uterus: Length: 6.56 cm   Width:  5.10 cm   Height:  4.67 cm Endo thickness:  6.67 mm   Left ovary:Normal Right ovary:Normal Fibroids:no CDS fluid:no Comment: Normal endometrium. Normal adnexas. No uterine masses are seen.   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

## 2013-03-17 ENCOUNTER — Other Ambulatory Visit: Payer: Self-pay | Admitting: Ophthalmology

## 2013-03-17 DIAGNOSIS — H4710 Unspecified papilledema: Secondary | ICD-10-CM

## 2013-03-22 ENCOUNTER — Ambulatory Visit
Admission: RE | Admit: 2013-03-22 | Discharge: 2013-03-22 | Disposition: A | Discharge status: Home / Self Care | Payer: BC Managed Care – PPO | Source: Ambulatory Visit | Attending: Ophthalmology | Admitting: Ophthalmology

## 2013-03-22 DIAGNOSIS — H4710 Unspecified papilledema: Secondary | ICD-10-CM

## 2013-03-22 MED ORDER — GADOBENATE DIMEGLUMINE 529 MG/ML IV SOLN
20.0000 mL | Freq: Once | INTRAVENOUS | Status: AC | PRN
  Administered 2013-03-22: 20 mL via INTRAVENOUS

## 2013-03-25 ENCOUNTER — Ambulatory Visit
Admission: RE | Admit: 2013-03-25 | Discharge: 2013-03-25 | Disposition: A | Discharge status: Home / Self Care | Payer: BC Managed Care – PPO | Source: Ambulatory Visit | Attending: Ophthalmology | Admitting: Ophthalmology

## 2013-03-25 ENCOUNTER — Other Ambulatory Visit (HOSPITAL_COMMUNITY)
Admission: RE | Admit: 2013-03-25 | Discharge: 2013-03-25 | Disposition: A | Discharge status: Home / Self Care | Payer: BC Managed Care – PPO | Source: Ambulatory Visit | Attending: Ophthalmology | Admitting: Ophthalmology

## 2013-03-25 VITALS — BP 139/82 | HR 86

## 2013-03-25 DIAGNOSIS — H4710 Unspecified papilledema: Secondary | ICD-10-CM

## 2013-03-25 LAB — GRAM STAIN: GRAM STAIN: NONE SEEN

## 2013-03-25 NOTE — Discharge Instructions (Signed)

## 2013-05-07 ENCOUNTER — Ambulatory Visit (HOSPITAL_BASED_OUTPATIENT_CLINIC_OR_DEPARTMENT_OTHER): Payer: BC Managed Care – PPO | Attending: Ophthalmology

## 2013-05-07 VITALS — Ht 67.0 in | Wt 290.0 lb

## 2013-05-07 DIAGNOSIS — G4730 Sleep apnea, unspecified: Secondary | ICD-10-CM

## 2013-05-07 DIAGNOSIS — G4710 Hypersomnia, unspecified: Secondary | ICD-10-CM | POA: Insufficient documentation

## 2013-05-07 DIAGNOSIS — G4733 Obstructive sleep apnea (adult) (pediatric): Secondary | ICD-10-CM

## 2013-05-08 DIAGNOSIS — G4733 Obstructive sleep apnea (adult) (pediatric): Secondary | ICD-10-CM

## 2013-05-08 NOTE — Sleep Study (Signed)
   NAME: Kristin Harper DATE OF BIRTH:  [birth year] MEDICAL RECORD NUMBER [id number]  LOCATION: [facility]  PHYSICIAN: YOUNG,CLINTON D  DATE OF STUDY: 05/07/2013  SLEEP STUDY TYPE: Nocturnal Polysomnogram               REFERRING PHYSICIAN: Dara Hoyer, MD  INDICATION FOR STUDY: Hypersomnia with sleep apnea  EPWORTH SLEEPINESS SCORE:  10/24  HEIGHT: 5\' 7"  (170.2 cm)  WEIGHT: 290 lb (131.543 kg)    Body mass index is 45.41 kg/(m^2).  NECK SIZE: 15 in.  MEDICATIONS: Charted for review  SLEEP ARCHITECTURE: Total sleep time 284.5 minutes with sleep efficiency 72.9%. Stage I was 14.1%, stage II 67.1%, stage III absent, REM 18.8% of total sleep time. Sleep latency 25 minutes, REM latency 86.5 minutes, awake after sleep onset 76.5 minutes, arousal index 1.3. Bedtime medication: None  RESPIRATORY DATA: Apnea hypopnea index (AHI) 8.4 per hour. 40 total events scored including for obstructive apneas and 36 hypopneas. Most events were associated with nonsupine sleep position. REM AHI 35.9 per hour. The study was ordered as a diagnostic polysomnogram and CPAP titration was not done.  OXYGEN DATA: Moderate snoring with oxygen desaturation to a nadir of 72% and mean oxygen saturation through the study of 94.3% on room air.  CARDIAC DATA: Sinus rhythm with PACs  MOVEMENT/PARASOMNIA: No significant movement disturbance, bathroom x1   IMPRESSION/ RECOMMENDATION:   1) Mild obstructive sleep apnea/hypopnea syndrome, AHI 8.4 per hour. Most events associated with nonsupine sleep position and REM. REM AHI 35.9 per hour.  Moderate snoring with oxygen desaturation to a nadir of 72% and mean oxygen saturation through the study of 94.3% on room air.  2) There were not enough respiratory events to meet protocol requirements for split CPAP titration. Weight loss would be recommended. An oral appliance might be considered. The patient could return for a dedicated CPAP titration study  if needed. 3) A previous polysomnogram on 04/29/2004 recorded AHI 54.9 per hour with body weight 245 pounds. 4) On the present study, sleep was markedly fragmented by nonspecific spontaneous awakenings throughout the night. Management for an insomnia component might be considered.  Signed Baird Lyons, MD [place], American Board of Sleep Medicine  ELECTRONICALLY SIGNED ON:  05/08/2013, 11:40 AM [place] PH: [phone]   FX: [phone] [place]

## 2013-12-20 ENCOUNTER — Encounter: Payer: Self-pay | Admitting: Obstetrics and Gynecology

## 2014-01-07 ENCOUNTER — Ambulatory Visit (INDEPENDENT_AMBULATORY_CARE_PROVIDER_SITE_OTHER): Payer: BC Managed Care – PPO | Admitting: Internal Medicine

## 2014-01-07 ENCOUNTER — Encounter: Payer: Self-pay | Admitting: Internal Medicine

## 2014-01-07 VITALS — BP 130/84 | Temp 98.2°F | Wt 287.1 lb

## 2014-01-07 DIAGNOSIS — H1132 Conjunctival hemorrhage, left eye: Secondary | ICD-10-CM

## 2014-01-07 DIAGNOSIS — Z77120 Contact with and (suspected) exposure to mold (toxic): Secondary | ICD-10-CM

## 2014-01-07 DIAGNOSIS — G932 Benign intracranial hypertension: Secondary | ICD-10-CM

## 2014-01-07 DIAGNOSIS — R0989 Other specified symptoms and signs involving the circulatory and respiratory systems: Secondary | ICD-10-CM

## 2014-01-07 DIAGNOSIS — R0689 Other abnormalities of breathing: Secondary | ICD-10-CM

## 2014-01-07 LAB — CBC WITH DIFFERENTIAL/PLATELET
BASOS PCT: 0.7 % (ref 0.0–3.0)
Basophils Absolute: 0 10*3/uL (ref 0.0–0.1)
EOS ABS: 0.2 10*3/uL (ref 0.0–0.7)
EOS PCT: 3.1 % (ref 0.0–5.0)
HEMATOCRIT: 42.7 % (ref 36.0–46.0)
Hemoglobin: 13.3 g/dL (ref 12.0–15.0)
LYMPHS ABS: 2.2 10*3/uL (ref 0.7–4.0)
Lymphocytes Relative: 34.4 % (ref 12.0–46.0)
MCHC: 31.1 g/dL (ref 30.0–36.0)
Monocytes Absolute: 0.4 10*3/uL (ref 0.1–1.0)
Monocytes Relative: 6.1 % (ref 3.0–12.0)
NEUTROS ABS: 3.5 10*3/uL (ref 1.4–7.7)
NEUTROS PCT: 55.7 % (ref 43.0–77.0)
Platelets: 327 10*3/uL (ref 150.0–400.0)
RBC: 6.18 Mil/uL — AB (ref 3.87–5.11)
RDW: 15.5 % (ref 11.5–15.5)
WBC: 6.4 10*3/uL (ref 4.0–10.5)

## 2014-01-07 LAB — BASIC METABOLIC PANEL
BUN: 9 mg/dL (ref 6–23)
CALCIUM: 9.4 mg/dL (ref 8.4–10.5)
CO2: 25 mEq/L (ref 19–32)
CREATININE: 0.8 mg/dL (ref 0.4–1.2)
Chloride: 108 mEq/L (ref 96–112)
GFR: 102.14 mL/min (ref >60.00)
Glucose, Bld: 74 mg/dL (ref 70–99)
Potassium: 4.2 mEq/L (ref 3.5–5.1)
Sodium: 139 mEq/L (ref 135–145)

## 2014-01-07 LAB — TSH: TSH: 0.83 u[IU]/mL (ref 0.35–4.50)

## 2014-01-07 LAB — SEDIMENTATION RATE: Sed Rate: 20 mm/hr (ref 0–22)

## 2014-01-07 NOTE — Progress Notes (Signed)
Pre visit review using our clinic review tool, if applicable. No additional management support is needed unless otherwise documented below in the visit note.   Chief Complaint  Patient presents with  . Mold and Mildew exposure    Pt has been exposed to mold and mildew at work for the last 8 years.    HPI: Patient Kristin Harper  comes in today for SDA for  new problem evaluation. Last ov with me was 2 years ago  Concern about exposures to mold at her work [place] abated  When moved to different  Building then  Not needing zyrtec .   And now concern  about this. Coming back in new office  Where black mold discovered alst Friday and part o f blg is cordoned off  For removal concern about long term consequences cause 2 coworkers go cancer  And died in  Recent past  No current sob  Asks about cxray .  Awoke this am with red spot left eye no other sx vision change Found out   december 2014 .     pseudotumor  Cerebra  When had.     Vision changes   Blurry vision. Opthalmology    Gevena Cotton .    rx with  Fluid med .    Sx improved but was going off and on .  ? Fu at this time  Never saw neurologist  Never had  HAs just vision changes  ROS: See pertinent positives and negatives per HPI.  Past Medical History  Diagnosis Date  . Hx of varicella   . Abnormal Pap smear     Over 10 years ago per pt, pre cancerous cells were removed.     Family History  Problem Relation Age of Onset  . Hypertension Mother   . Other Father     shot and killed age 36  . Heart attack Maternal Aunt     History   Social History  . Marital Status: Single    Spouse Name: N/A    Number of Children: N/A  . Years of Education: N/A   Social History Main Topics  . Smoking status: Never Smoker   . Smokeless tobacco: Never Used  . Alcohol Use: Yes     Comment: socially  . Drug Use: No  . Sexual Activity:    Partners: Male    Birth Control/ Protection: Condom   Other Topics Concern  . None    Social History Narrative   g 1 p 0    No tobacco    internet based business   Works at [employer] Prescriptions as of 01/07/2014  Medication Sig  . acetaZOLAMIDE (DIAMOX) 500 MG capsule   . [DISCONTINUED] cetirizine (ZYRTEC) 10 MG tablet Take 10 mg by mouth daily.  . [DISCONTINUED] nitrofurantoin, macrocrystal-monohydrate, (MACROBID) 100 MG capsule Take 1 capsule (100 mg total) by mouth 2 (two) times daily.    EXAM:  BP 130/84 mmHg  Temp(Src) 98.2 F (36.8 C) (Oral)  Wt 287 lb 1.6 oz (130.228 kg)  Body mass index is 44.96 kg/(m^2).  GENERAL: vitals reviewed and listed above, alert, oriented, appears well hydrated and in no acute distress HEENT: atraumatic, conjunctiva  small left conj hemorrage no obvious abnormalities on inspection of external nose and ears mild congestion  Looks slightly allergic OP : no lesion edema or exudate  NECK: no obvious masses on inspection palpation  LUNGS: clear to auscultation bilaterally, no  wheezes, rales or rhonchi, good air movement abd soft no obv masses CV: HRRR, no clubbing cyanosis or  peripheral edema nl cap refill  MS: moves all extremities without noticeable focal  Abnormality Neuro grossly nl nl gait  PSYCH: pleasant and cooperative, no obvious depression or anxiety  ASSESSMENT AND PLAN:  Discussed the following assessment and plan:  Pseudotumor cerebri syndrome - uncertain fu  discimportance of adeqate rx and also weight loss other intervntions refer to neurology fo other  fu care  opthal also  if advised  - Plan: Basic metabolic panel, CBC with Differential, Sedimentation rate, TSH, Ambulatory referral to Neurology  Subconjunctival hemorrhage, non-traumatic, left - bp ok no obv bleeding cbc reassuance but check with opthal if any sx - Plan: Basic metabolic panel, CBC with Differential, Sedimentation rate, TSH  Mold exposure - mild sx  agree with avoiadance offered allergy pulm consult but will get x ray and  see how it goes at this time - Plan: Basic metabolic panel, CBC with Differential, Sedimentation rate, TSH  Respiratory symptom or sign - ur congestion - Plan: Basic metabolic panel, CBC with Differential, Sedimentation rate, TSH, DG Chest 2 View  -Patient advised to return or notify health care team  if symptoms worsen ,persist or new concerns arise.  Patient Instructions  Advise see neurologist  Regarding the dx of pseudotumor  tumor   .Marland Kitchen Stay on medication at this time  Labs and x ray  Will le you know results . dont think the mold exposures has increased your risk of cancer based on what we know today  But agree to avoid exposures .  Fu eye doc about any eye sx. Also     Pseudotumor Cerebri Pseudotumor cerebri, also called idiopathic intracranial hypertension, is a condition that occurs due to increased pressure within your skull. Although some of the symptoms resemble those of a brain tumor, it is not a brain tumor. Symptoms occur when the increased pressure in your skull compresses brain structures. For example, pressure on the nerve responsible for vision (optic nerve) causes it to swell, resulting in visual symptoms. Pseudotumor cerebri tends to occur in obese women younger than 37 years of age. However, men and children can also develop this condition. SYMPTOMS  Symptoms of pseudotumor cerebri occur due to increased pressure within the skull. Symptoms may include:   Headaches.  Nausea and vomiting.  Dizziness.  High blood pressure.   Ringing in the ears.  Double or blurred vision.  Brief episodes of complete loss of vision.  Pain in the back, neck, or shoulders. DIAGNOSIS  Pseudotumor cerebri is diagnosed through:  A detailed eye exam, which can reveal a swollen optic nerve, as well as identifying issues such as blind spots in the vision.  An MRI or CT scan to rule out other disorders that can cause similar symptoms, such as brain tumors.  A spinal tap (lumbar  puncture), which can demonstrate increased pressure within the skull. TREATMENT  There are several ways that pseudotumor cerebri is treated, including:  Medicines to decrease the production of spinal fluid and lower the pressure within your skull.  Medicines to prevent or treat headaches.  Surgery to create an opening in your optic nerve to allow excess fluid to drain out.  Surgery to place drains (shunts) in your brain to remove excess fluid. HOME CARE INSTRUCTIONS   Take all medicines as directed by your health care provider.  Go to all of your follow-up appointments.  Lose weight if  you are overweight. SEEK MEDICAL CARE IF:  Any symptoms come back.  You develop trouble with hearing, vision, balance, or your sense of smell.  You cannot eat or drink what you need.  You are more weak or tired than usual.   You are losing weight without trying. SEEK IMMEDIATE MEDICAL CARE IF:  You have new symptoms such as vision problems or difficulty walking.   You have a seizure.   You have trouble breathing.   You have a fever.  Document Released: 02/09/2013 Document Reviewed: 02/09/2013 [facility] Patient Information 2015 [place], [place]. This information is not intended to replace advice given to you by your health care provider. Make sure you discuss any questions you have with your health care provider.       Standley Brooking. Panosh M.D.

## 2014-01-07 NOTE — Patient Instructions (Addendum)
Advise see neurologist  Regarding the dx of pseudotumor  tumor   .Marland Kitchen Stay on medication at this time  Labs and x ray  Will le you know results . dont think the mold exposures has increased your risk of cancer based on what we know today  But agree to avoid exposures .  Fu eye doc about any eye sx. Also     Pseudotumor Cerebri Pseudotumor cerebri, also called idiopathic intracranial hypertension, is a condition that occurs due to increased pressure within your skull. Although some of the symptoms resemble those of a brain tumor, it is not a brain tumor. Symptoms occur when the increased pressure in your skull compresses brain structures. For example, pressure on the nerve responsible for vision (optic nerve) causes it to swell, resulting in visual symptoms. Pseudotumor cerebri tends to occur in obese women younger than 37 years of age. However, men and children can also develop this condition. SYMPTOMS  Symptoms of pseudotumor cerebri occur due to increased pressure within the skull. Symptoms may include:   Headaches.  Nausea and vomiting.  Dizziness.  High blood pressure.   Ringing in the ears.  Double or blurred vision.  Brief episodes of complete loss of vision.  Pain in the back, neck, or shoulders. DIAGNOSIS  Pseudotumor cerebri is diagnosed through:  A detailed eye exam, which can reveal a swollen optic nerve, as well as identifying issues such as blind spots in the vision.  An MRI or CT scan to rule out other disorders that can cause similar symptoms, such as brain tumors.  A spinal tap (lumbar puncture), which can demonstrate increased pressure within the skull. TREATMENT  There are several ways that pseudotumor cerebri is treated, including:  Medicines to decrease the production of spinal fluid and lower the pressure within your skull.  Medicines to prevent or treat headaches.  Surgery to create an opening in your optic nerve to allow excess fluid to drain  out.  Surgery to place drains (shunts) in your brain to remove excess fluid. HOME CARE INSTRUCTIONS   Take all medicines as directed by your health care provider.  Go to all of your follow-up appointments.  Lose weight if you are overweight. SEEK MEDICAL CARE IF:  Any symptoms come back.  You develop trouble with hearing, vision, balance, or your sense of smell.  You cannot eat or drink what you need.  You are more weak or tired than usual.   You are losing weight without trying. SEEK IMMEDIATE MEDICAL CARE IF:  You have new symptoms such as vision problems or difficulty walking.   You have a seizure.   You have trouble breathing.   You have a fever.  Document Released: 02/09/2013 Document Reviewed: 02/09/2013 [facility] Patient Information 2015 [place], [place]. This information is not intended to replace advice given to you by your health care provider. Make sure you discuss any questions you have with your health care provider.

## 2014-01-08 ENCOUNTER — Encounter: Payer: Self-pay | Admitting: Internal Medicine

## 2014-01-11 ENCOUNTER — Telehealth: Payer: Self-pay | Admitting: Family Medicine

## 2014-01-11 NOTE — Telephone Encounter (Signed)
Sometimes from low iron but sometimes     may have been born with this situation. The variant is how  their hemoglobin is made .   Would advise  We can get hg immuno or electrophoresis    Ferritin, IBC  Panel   Lead level,  And path blood smear smear At her convenience  Dx microcytosis

## 2014-01-11 NOTE — Telephone Encounter (Signed)
Pt would like to know what it means that her red blood cells are small.  Is it a sign of something?

## 2014-01-17 NOTE — Telephone Encounter (Signed)
LM on home number for the pt to return my call.

## 2014-01-19 ENCOUNTER — Ambulatory Visit (INDEPENDENT_AMBULATORY_CARE_PROVIDER_SITE_OTHER)
Admission: RE | Admit: 2014-01-19 | Discharge: 2014-01-19 | Disposition: A | Discharge status: Home / Self Care | Payer: BC Managed Care – PPO | Source: Ambulatory Visit | Attending: Internal Medicine | Admitting: Internal Medicine

## 2014-01-19 DIAGNOSIS — R0689 Other abnormalities of breathing: Secondary | ICD-10-CM

## 2014-01-19 DIAGNOSIS — R0989 Other specified symptoms and signs involving the circulatory and respiratory systems: Secondary | ICD-10-CM

## 2014-01-19 NOTE — Telephone Encounter (Signed)
LM on home phone for the pt to return my call. 

## 2014-01-20 NOTE — Telephone Encounter (Signed)
LM on home phone for the pt to return my call.  Have tried multiple times to reach the pt.  Will now close the note.

## 2014-01-26 ENCOUNTER — Telehealth: Payer: Self-pay | Admitting: Family Medicine

## 2014-01-26 ENCOUNTER — Other Ambulatory Visit: Payer: Self-pay | Admitting: Family Medicine

## 2014-01-26 DIAGNOSIS — R718 Other abnormality of red blood cells: Secondary | ICD-10-CM

## 2014-01-26 NOTE — Telephone Encounter (Signed)
Spoke to the pt.  She will proceed with the labs.  Orders placed in the system.

## 2014-01-26 NOTE — Telephone Encounter (Signed)
Please look to make sure future lab orders are correct.  Thanks!

## 2014-02-02 NOTE — Telephone Encounter (Signed)
Need hg electrophoreses i will add this order  Rest is fine

## 2014-02-03 ENCOUNTER — Other Ambulatory Visit: Payer: Self-pay | Admitting: Family Medicine

## 2014-02-07 ENCOUNTER — Other Ambulatory Visit: Payer: BC Managed Care – PPO

## 2014-02-14 ENCOUNTER — Other Ambulatory Visit (INDEPENDENT_AMBULATORY_CARE_PROVIDER_SITE_OTHER): Payer: BC Managed Care – PPO

## 2014-02-14 DIAGNOSIS — R718 Other abnormality of red blood cells: Secondary | ICD-10-CM

## 2014-02-14 LAB — IBC PANEL
IRON: 98 ug/dL (ref 42–145)
Saturation Ratios: 25.6 % (ref 20.0–50.0)
Transferrin: 273.3 mg/dL (ref 212.0–360.0)

## 2014-02-14 LAB — FERRITIN: FERRITIN: 19.8 ng/mL (ref 10.0–291.0)

## 2014-02-16 LAB — HEMOGLOBINOPATHY EVALUATION
Hemoglobin Other: 0 %
Hgb A2 Quant: 2.1 % — ABNORMAL LOW (ref 2.2–3.2)
Hgb A: 97.4 % (ref 96.8–97.8)
Hgb F Quant: 0.5 % (ref 0.0–2.0)
Hgb S Quant: 0 %

## 2014-02-16 LAB — LEAD, BLOOD: Lead-Whole Blood: <2 ug/dL (ref <10)

## 2014-03-04 ENCOUNTER — Ambulatory Visit (HOSPITAL_COMMUNITY): Payer: BC Managed Care – PPO | Attending: Family Medicine

## 2014-03-04 ENCOUNTER — Emergency Department (HOSPITAL_COMMUNITY)
Admission: EM | Admit: 2014-03-04 | Discharge: 2014-03-04 | Disposition: A | Discharge status: Home / Self Care | Payer: BC Managed Care – PPO | Source: Home / Self Care | Attending: Family Medicine | Admitting: Family Medicine

## 2014-03-04 ENCOUNTER — Telehealth: Payer: Self-pay | Admitting: Internal Medicine

## 2014-03-04 DIAGNOSIS — R0789 Other chest pain: Secondary | ICD-10-CM | POA: Diagnosis not present

## 2014-03-04 DIAGNOSIS — R079 Chest pain, unspecified: Secondary | ICD-10-CM

## 2014-03-04 DIAGNOSIS — R0782 Intercostal pain: Secondary | ICD-10-CM

## 2014-03-04 NOTE — ED Provider Notes (Signed)
Kristin Harper is a 39 y.o. female who presents to Urgent Care today for chest pain. Patient has a one-day history of right-sided chest pain. The pain is mild described as soreness. The pain does not radiate. She denies any exertional component. No palpitations lightheadedness or dizziness. She feels well otherwise. No fevers or chills. Her medical history is significant for pseudotumor cerebri currently taking Diamox.   Past Medical History  Diagnosis Date  . Hx of varicella   . Abnormal Pap smear     Over 10 years ago per pt, pre cancerous cells were removed.    Past Surgical History  Procedure Laterality Date  . Dilation and curettage of uterus  2005    In [place]  . Tonsillectomy    . Turbinate resection  2004  . Nasal septum surgery  2004   History  Substance Use Topics  . Smoking status: Never Smoker   . Smokeless tobacco: Never Used  . Alcohol Use: Yes     Comment: socially   ROS as above Medications: No current facility-administered medications for this encounter.   Current Outpatient Prescriptions  Medication Sig Dispense Refill  . acetaZOLAMIDE (DIAMOX) 500 MG capsule   3   Allergies  Allergen Reactions  . Penicillins Hives and Rash     Exam:  BP 139/84 mmHg  Pulse 68  Temp(Src) 98.5 F (36.9 C) (Oral)  Resp 18  SpO2 98%  LMP 03/04/2014 Gen: Well NAD obese HEENT: EOMI,  MMM Lungs: Normal work of breathing. CTABL Heart: RRR no MRG Chest wall: Tender right anterior chest wall at the area of chest soreness. Abd: NABS, Soft. Nondistended, Nontender Exts: Brisk capillary refill, warm and well perfused.   Twelve-lead EKG: Normal sinus rhythm at 72 bpm. No arrhythmia. Normal P waves. Patient has tiny Q waves in the inferior leads. Normal T wave morphology. No ST segment elevation or depression. QTC 424. No prior EKGs to compare to. Impression largely normal EKG.  No results found for this or any previous visit (from the past 24 hour(s)). Dg Chest 2  View  03/04/2014   CLINICAL DATA:  Right-sided chest pain for 2 days.  EXAM: CHEST  2 VIEW  COMPARISON:  January 19, 2014.  FINDINGS: The heart size and mediastinal contours are within normal limits. Both lungs are clear. No pneumothorax or pleural effusion is noted. The visualized skeletal structures are unremarkable.  IMPRESSION: No acute cardiopulmonary abnormality seen.   Electronically Signed   By: Sabino Dick M.D.   On: 03/04/2014 15:15    Assessment and Plan: 39 y.o. female with right-sided chest wall pain. Very unlikely to be cardiogenic area discussed options. Plan to follow-up with cardiology for risk factor stratification. Red flag precautions reviewed. Treat pain with naproxen sodium.  Discussed warning signs or symptoms. Please see discharge instructions. Patient expresses understanding.     Gregor Hams, MD 03/04/14 [phone]

## 2014-03-04 NOTE — Telephone Encounter (Signed)
[facility] - Client [place] [facility] Patient Name: Kristin Harper DOB: [birth year] Nurse Assessment Nurse: Markus Daft, RN, [place] Date/Time ([place] Time): 03/04/2014 11:31:10 AM Confirm and document reason for call. If symptomatic, describe symptoms. ---Caller says she started having pain on her upper right breast area of her chest above her breast yesterday, and still has chest pain which feels like an irritating "nag" or throbbing, rates pain now at 4-5/10. Felt more yesterday than today. It is not more tender to touch. Has the patient traveled out of the country within the last 30 days? ---Not Applicable Does the patient require triage? ---Yes Related visit to physician within the last 2 weeks? ---No Does the PT have any chronic conditions? (i.e. diabetes, asthma, etc.) ---Yes List chronic conditions. ---Overweight and sometimes HTN - but not on medications yet. Did the patient indicate they were pregnant? ---No Guidelines Guideline Title Affirmed Question Affirmed Notes Chest Pain [1] Chest pain lasts > 5 minutes AND [2] age > 78 AND [3] at least one cardiac risk factor (i.e., hypertension, diabetes, obesity, smoker or strong family history of heart disease) Final Disposition User Call EMS 911 Now [place], Therapist, sports, [employer]

## 2014-03-04 NOTE — ED Notes (Signed)
Bed: UC06 Expected date:  Expected time:  Means of arrival:  Comments: Hold

## 2014-03-04 NOTE — Discharge Instructions (Signed)
Thank you for coming in today. Take up to 2 aleve twice daily for pain.  Follow up with Cardiology on Feb 8th.  Go to the ER if you worsen.  Call or go to the emergency room if you get worse, have trouble breathing, have chest pains, or palpitations.    Chest Pain (Nonspecific) It is often hard to give a specific diagnosis for the cause of chest pain. There is always a chance that your pain could be related to something serious, such as a heart attack or a blood clot in the lungs. You need to follow up with your health care provider for further evaluation. CAUSES   Heartburn.  Pneumonia or bronchitis.  Anxiety or stress.  Inflammation around your heart (pericarditis) or lung (pleuritis or pleurisy).  A blood clot in the lung.  A collapsed lung (pneumothorax). It can develop suddenly on its own (spontaneous pneumothorax) or from trauma to the chest.  Shingles infection (herpes zoster virus). The chest wall is composed of bones, muscles, and cartilage. Any of these can be the source of the pain.  The bones can be bruised by injury.  The muscles or cartilage can be strained by coughing or overwork.  The cartilage can be affected by inflammation and become sore (costochondritis). DIAGNOSIS  Lab tests or other studies may be needed to find the cause of your pain. Your health care provider may have you take a test called an ambulatory electrocardiogram (ECG). An ECG records your heartbeat patterns over a 24-hour period. You may also have other tests, such as:  Transthoracic echocardiogram (TTE). During echocardiography, sound waves are used to evaluate how blood flows through your heart.  Transesophageal echocardiogram (TEE).  Cardiac monitoring. This allows your health care provider to monitor your heart rate and rhythm in real time.  Holter monitor. This is a portable device that records your heartbeat and can help diagnose heart arrhythmias. It allows your health care provider to  track your heart activity for several days, if needed.  Stress tests by exercise or by giving medicine that makes the heart beat faster. TREATMENT   Treatment depends on what may be causing your chest pain. Treatment may include:  Acid blockers for heartburn.  Anti-inflammatory medicine.  Pain medicine for inflammatory conditions.  Antibiotics if an infection is present.  You may be advised to change lifestyle habits. This includes stopping smoking and avoiding alcohol, caffeine, and chocolate.  You may be advised to keep your head raised (elevated) when sleeping. This reduces the chance of acid going backward from your stomach into your esophagus. Most of the time, nonspecific chest pain will improve within 2-3 days with rest and mild pain medicine.  HOME CARE INSTRUCTIONS   If antibiotics were prescribed, take them as directed. Finish them even if you start to feel better.  For the next few days, avoid physical activities that bring on chest pain. Continue physical activities as directed.  Do not use any tobacco products, including cigarettes, chewing tobacco, or electronic cigarettes.  Avoid drinking alcohol.  Only take medicine as directed by your health care provider.  Follow your health care provider's suggestions for further testing if your chest pain does not go away.  Keep any follow-up appointments you made. If you do not go to an appointment, you could develop lasting (chronic) problems with pain. If there is any problem keeping an appointment, call to reschedule. SEEK MEDICAL CARE IF:   Your chest pain does not go away, even after treatment.  You have a rash with blisters on your chest.  You have a fever. SEEK IMMEDIATE MEDICAL CARE IF:   You have increased chest pain or pain that spreads to your arm, neck, jaw, back, or abdomen.  You have shortness of breath.  You have an increasing cough, or you cough up blood.  You have severe back or abdominal  pain.  You feel nauseous or vomit.  You have severe weakness.  You faint.  You have chills. This is an emergency. Do not wait to see if the pain will go away. Get medical help at once. Call your local emergency services (911 in [place] Do not drive yourself to the hospital. MAKE SURE YOU:   Understand these instructions.  Will watch your condition.  Will get help right away if you are not doing well or get worse. Document Released: 11/14/2004 Document Revised: 02/09/2013 Document Reviewed: 09/10/2007 [facility] Patient Information 2015 [place], [place]. This information is not intended to replace advice given to you by your health care provider. Make sure you discuss any questions you have with your health care provider.

## 2014-03-04 NOTE — ED Notes (Signed)
Off the floor for chest xray

## 2014-03-06 ENCOUNTER — Encounter (HOSPITAL_COMMUNITY): Payer: Self-pay | Admitting: Emergency Medicine

## 2014-03-06 NOTE — ED Notes (Signed)
Reports chest "soreness" worse with movement, no known injury

## 2014-03-07 NOTE — Telephone Encounter (Signed)
Pt went to ED on 1.15.2016

## 2014-03-14 LAB — HM PAP SMEAR: HM Pap smear: NEGATIVE

## 2014-03-22 ENCOUNTER — Telehealth (HOSPITAL_COMMUNITY): Payer: Self-pay | Admitting: *Deleted

## 2014-03-22 NOTE — ED Notes (Signed)
Pt. Called in and said she lost her paperwork.  She asked for the referral information.  Pt. verified with DOB and given the information.  She repeated it back to me. Roselyn Meier 03/22/2014

## 2014-03-28 ENCOUNTER — Ambulatory Visit: Payer: BC Managed Care – PPO | Admitting: Cardiovascular Disease

## 2014-04-05 ENCOUNTER — Ambulatory Visit (INDEPENDENT_AMBULATORY_CARE_PROVIDER_SITE_OTHER): Payer: BC Managed Care – PPO | Admitting: Neurology

## 2014-04-05 ENCOUNTER — Encounter: Payer: Self-pay | Admitting: Neurology

## 2014-04-05 VITALS — BP 132/70 | HR 96 | Temp 98.7°F | Resp 18 | Ht 66.0 in | Wt 287.1 lb

## 2014-04-05 DIAGNOSIS — G932 Benign intracranial hypertension: Secondary | ICD-10-CM

## 2014-04-05 NOTE — Progress Notes (Addendum)
NEUROLOGY CONSULTATION NOTE  Kristin Harper MRN: [id number] DOB: 1975-05-25  Referring provider: Dr. Regis Bill Primary care provider: Dr. Regis Bill  Reason for consult:  Idiopathic intracranial hypertension  HISTORY OF PRESENT ILLNESS: Kristin Harper is a 39 year old right-handed woman who presents for idiopathic intracranial hypertension.  Records, labs and MRI of brain personally reviewed.  About a year ago, she noted hazy vision in the right eye.  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  MRI of the brain with and without contrast was performed on 03/22/13, which showed mild cerebellar tonsillar ectopia but overall unremarkable.  She reportedly had a lumbar puncture which revealed an elevated opening pressure.  She had occasional mild headaches, but nothing significant.  She denied pulsatile tinnitus.  She was started on acetazolamide ER 500mg .  She was experiencing significant paresthesias on twice daily dosing, so she was reduced to once daily dosing.  She still has paresthesias, but not as severe.  Symptoms are better if she keeps hydrated.  She has had eye evaluations over the past year, which revealed no papilledema.  Physically she is doing well.  She denies headaches and vision problems.  Labs from November include Sed Rate of 20 and TSH of 0.83.  PAST MEDICAL HISTORY: Past Medical History  Diagnosis Date  . Hx of varicella   . Abnormal Pap smear     Over 10 years ago per pt, pre cancerous cells were removed.     PAST SURGICAL HISTORY: Past Surgical History  Procedure Laterality Date  . Dilation and curettage of uterus  2005    In [place]  . Tonsillectomy    . Turbinate resection  2004  . Nasal septum surgery  2004    MEDICATIONS: Current Outpatient Prescriptions on File Prior to Visit  Medication Sig Dispense Refill  . acetaZOLAMIDE (DIAMOX) 500 MG capsule   3   No current facility-administered medications on file prior to visit.     ALLERGIES: Allergies  Allergen Reactions  . Penicillins Hives and Rash    FAMILY HISTORY: Family History  Problem Relation Age of Onset  . Hypertension Mother   . Other Father     shot and killed age 66  . Heart attack Maternal Aunt     SOCIAL HISTORY: History   Social History  . Marital Status: Single    Spouse Name: N/A  . Number of Children: N/A  . Years of Education: N/A   Occupational History  . Not on file.   Social History Main Topics  . Smoking status: Never Smoker   . Smokeless tobacco: Never Used  . Alcohol Use: Yes     Comment: socially  . Drug Use: No  . Sexual Activity:    Partners: Male    Birth Control/ Protection: Condom   Other Topics Concern  . Not on file   Social History Narrative   g 1 p 0    No tobacco    internet based business   Works at [employer]: Constitutional: No fevers, chills, or sweats, no generalized fatigue, change in appetite Eyes: No visual changes, double vision, eye pain Ear, nose and throat: No hearing loss, ear pain, nasal congestion, sore throat Cardiovascular: No chest pain, palpitations Respiratory:  No shortness of breath at rest or with exertion, wheezes GastrointestinaI: No nausea, vomiting, diarrhea, abdominal pain, fecal incontinence Genitourinary:  No dysuria, urinary retention or frequency Musculoskeletal:  No neck pain, back pain Integumentary: No rash, pruritus,  skin lesions Neurological: as above Psychiatric: No depression, insomnia, anxiety Endocrine: No palpitations, fatigue, diaphoresis, mood swings, change in appetite, change in weight, increased thirst Hematologic/Lymphatic:  No anemia, purpura, petechiae. Allergic/Immunologic: no itchy/runny eyes, nasal congestion, recent allergic reactions, rashes  PHYSICAL EXAM: Filed Vitals:   04/05/14 1411  BP: 132/70  Pulse: 96  Temp: 98.7 F (37.1 C)  Resp: 18   General: No acute distress Head:  Normocephalic/atraumatic Eyes:   fundi unremarkable, without vessel changes, exudates, hemorrhages or papilledema. Neck: supple, no paraspinal tenderness, full range of motion Back: No paraspinal tenderness Heart: regular rate and rhythm Lungs: Clear to auscultation bilaterally. Vascular: No carotid bruits. Neurological Exam: Mental status: alert and oriented to person, place, and time, recent and remote memory intact, fund of knowledge intact, attention and concentration intact, speech fluent and not dysarthric, language intact. Cranial nerves: CN I: not tested CN II: pupils equal, round and reactive to light, visual fields intact, fundi unremarkable, without vessel changes, exudates, hemorrhages or papilledema. CN III, IV, VI:  full range of motion, no nystagmus, no ptosis CN V: facial sensation intact CN VII: upper and lower face symmetric CN VIII: hearing intact CN IX, X: gag intact, uvula midline CN XI: sternocleidomastoid and trapezius muscles intact CN XII: tongue midline Bulk & Tone: normal, no fasciculations. Motor:  5/5 throughout Sensation:  Temperature and vibration intact. Deep Tendon Reflexes:  2+ throughout, toes downgoing Finger to nose testing:  No dysmetria Heel to shin:  No dysmetria Gait:  Normal station and stride.  Able to turn and walk in tandem. Romberg negative.  IMPRESSION: Idiopathic intracranial hypertension Morbid obesity  PLAN: 1.  Continue acetazolamide ER 500mg  daily 2.  Follow up with ophthalmology as scheduled in March 3.  Weight loss 4.  Follow up in 3 months.  Thank you for allowing me to take part in the care of this patient.  Metta Clines, DO  CC:  Shanon Ace, MD  ADDENDUM:  Reviewed LP procedure note from 03/25/13.  She had an opening pressure of 36 cm H2O and closing pressure of 12 cm H2O.

## 2014-04-05 NOTE — Patient Instructions (Signed)
1.  Continue acetazolamide 500mg  daily.   2.  Follow up for eye exam next month.  Have note sent to me. 3.  Work on weight loss. 4.  Follow up in 3 months.

## 2015-01-07 ENCOUNTER — Emergency Department (HOSPITAL_COMMUNITY)
Admission: EM | Admit: 2015-01-07 | Discharge: 2015-01-07 | Disposition: A | Discharge status: Home / Self Care | Payer: BC Managed Care – PPO | Source: Home / Self Care | Attending: Family Medicine | Admitting: Family Medicine

## 2015-01-07 ENCOUNTER — Encounter (HOSPITAL_COMMUNITY): Payer: Self-pay | Admitting: Emergency Medicine

## 2015-01-07 DIAGNOSIS — H6692 Otitis media, unspecified, left ear: Secondary | ICD-10-CM | POA: Diagnosis not present

## 2015-01-07 DIAGNOSIS — R059 Cough, unspecified: Secondary | ICD-10-CM

## 2015-01-07 DIAGNOSIS — R05 Cough: Secondary | ICD-10-CM | POA: Diagnosis not present

## 2015-01-07 MED ORDER — AZITHROMYCIN 250 MG PO TABS: ORAL_TABLET | ORAL | Status: DC

## 2015-01-07 NOTE — ED Notes (Signed)
Cough/cold and congestion.  Symptoms started Monday.

## 2015-01-07 NOTE — Discharge Instructions (Signed)
°  It was nice meeting you. It seems you have left ear infection. I have given you prescription for Zithromax. Ideally I should have given Amoxicillin but since you have penicillin allergy this was not given. Continue Mucinex. Continue Zyrtec. See [place] soon if no improvement.  Otitis Media, Adult Otitis media is redness, soreness, and puffiness (swelling) in the space just behind your eardrum (middle ear). It may be caused by allergies or infection. It often happens along with a cold. HOME CARE  Take your medicine as told. Finish it even if you start to feel better.  Only take over-the-counter or prescription medicines for pain, discomfort, or fever as told by your doctor.  Follow up with your doctor as told. GET HELP IF:  You have otitis media only in one ear, or bleeding from your nose, or both.  You notice a lump on your neck.  You are not getting better in 3-5 days.  You feel worse instead of better. GET HELP RIGHT AWAY IF:   You have pain that is not helped with medicine.  You have puffiness, redness, or pain around your ear.  You get a stiff neck.  You cannot move part of your face (paralysis).  You notice that the bone behind your ear hurts when you touch it. MAKE SURE YOU:   Understand these instructions.  Will watch your condition.  Will get help right away if you are not doing well or get worse.   This information is not intended to replace advice given to you by your health care provider. Make sure you discuss any questions you have with your health care provider.   Document Released: 07/24/2007 Document Revised: 02/25/2014 Document Reviewed: 09/01/2012 Elsevier Interactive Patient Education [employer].

## 2015-01-07 NOTE — ED Provider Notes (Signed)
CSN: [id number]     Arrival date & time 01/07/15  1937 History   None    No chief complaint on file.  (Consider location/radiation/quality/duration/timing/severity/associated sxs/prior Treatment) Patient is a 39 y.o. female presenting with cough.  Cough Cough characteristics:  Productive Sputum characteristics:  Yellow Severity:  Mild Onset quality:  Gradual Timing:  Constant Progression:  Worsening Chronicity:  New Context: sick contacts   Context comment:  Baby sat her God son on Saturday who was sick. Relieved by:  Nothing Ineffective treatments: taking Mucinex. robitussin and teraflu. Associated symptoms: fever   Associated symptoms: no chest pain, no eye discharge, no headaches, no shortness of breath, no sinus congestion, no sore throat and no wheezing   Associated symptoms comment:  Ear irritation from coughing   Past Medical History  Diagnosis Date  . Hx of varicella   . Abnormal Pap smear     Over 10 years ago per pt, pre cancerous cells were removed.    Past Surgical History  Procedure Laterality Date  . Dilation and curettage of uterus  2005    In [place]  . Tonsillectomy    . Turbinate resection  2004  . Nasal septum surgery  2004   Family History  Problem Relation Age of Onset  . Hypertension Mother   . Other Father     shot and killed age 43  . Heart attack Maternal Aunt    Social History  Substance Use Topics  . Smoking status: Never Smoker   . Smokeless tobacco: Never Used  . Alcohol Use: Yes     Comment: socially   OB History    Gravida Para Term Preterm AB TAB SAB Ectopic Multiple Living   1    1  1    0     Review of Systems  Constitutional: Positive for fever.  HENT: Negative for sore throat.   Eyes: Negative for discharge.  Respiratory: Positive for cough. Negative for shortness of breath and wheezing.   Cardiovascular: Negative for chest pain.  Neurological: Negative for headaches.    Allergies  Penicillins  Home Medications    Prior to Admission medications   Medication Sig Start Date End Date Taking? Authorizing Provider  acetaZOLAMIDE (DIAMOX) 500 MG capsule  12/17/13   Historical Provider, MD   Meds Ordered and Administered this Visit  Medications - No data to display  There were no vitals taken for this visit. No data found.   Physical Exam  Constitutional: She appears well-developed. No distress.  HENT:  Head: Normocephalic and atraumatic.  Right Ear: Tympanic membrane, external ear and ear canal normal.  Left Ear: External ear and ear canal normal.  Ears:  Mouth/Throat: Uvula is midline, oropharynx is clear and moist and mucous membranes are normal.  Eyes: Conjunctivae and EOM are normal. Pupils are equal, round, and reactive to light.  Cardiovascular: Normal rate, regular rhythm, normal heart sounds and intact distal pulses.   No murmur heard. Pulmonary/Chest: Effort normal and breath sounds normal. No respiratory distress. She has no wheezes. She exhibits no tenderness.  Nursing note and vitals reviewed.   ED Course  Procedures (including critical care time)  Labs Review Labs Reviewed - No data to display  Imaging Review No results found.   Visual Acuity Review  Right Eye Distance:   Left Eye Distance:   Bilateral Distance:    Right Eye Near:   Left Eye Near:    Bilateral Near:         MDM  No diagnosis found. Left ear otitis media. URI  Zithromax prescribed for ear infection. She has penicillin allergy hence Amoxicillin was not prescribed. May use Tylenol as needed for fever and pain. Continue Mucinex prn cough.  She has Zyrtec at home for sinus problem. She may use this as well. Return precaution discussed.   Kinnie Feil, MD 01/07/15 2015

## 2015-07-23 ENCOUNTER — Ambulatory Visit (HOSPITAL_COMMUNITY)
Admission: EM | Admit: 2015-07-23 | Discharge: 2015-07-23 | Disposition: A | Discharge status: Home / Self Care | Payer: BC Managed Care – PPO | Attending: Family Medicine | Admitting: Family Medicine

## 2015-07-23 ENCOUNTER — Encounter (HOSPITAL_COMMUNITY): Payer: Self-pay | Admitting: Emergency Medicine

## 2015-07-23 DIAGNOSIS — R350 Frequency of micturition: Secondary | ICD-10-CM | POA: Insufficient documentation

## 2015-07-23 DIAGNOSIS — Z880 Allergy status to penicillin: Secondary | ICD-10-CM | POA: Diagnosis not present

## 2015-07-23 LAB — POCT URINALYSIS DIP (DEVICE)
Bilirubin Urine: NEGATIVE
GLUCOSE, UA: NEGATIVE mg/dL
Ketones, ur: NEGATIVE mg/dL
Leukocytes, UA: NEGATIVE
NITRITE: NEGATIVE
PROTEIN: NEGATIVE mg/dL
Specific Gravity, Urine: 1.02 (ref 1.005–1.030)
UROBILINOGEN UA: 0.2 mg/dL (ref 0.0–1.0)
pH: 7 (ref 5.0–8.0)

## 2015-07-23 LAB — POCT PREGNANCY, URINE: PREG TEST UR: NEGATIVE

## 2015-07-23 MED ORDER — CIPROFLOXACIN HCL 250 MG PO TABS
250.0000 mg | ORAL_TABLET | Freq: Two times a day (BID) | ORAL | Status: DC
Start: 2015-07-23 — End: 2015-07-28

## 2015-07-23 NOTE — ED Provider Notes (Signed)
CSN: [id number]     Arrival date & time 07/23/15  1853 History   First MD Initiated Contact with Patient 07/23/15 1914     Chief Complaint  Patient presents with  . Urinary Frequency   (Consider location/radiation/quality/duration/timing/severity/associated sxs/prior Treatment) Patient is a 40 y.o. female presenting with frequency. The history is provided by the patient. No language interpreter was used.  Urinary Frequency This is a new problem. The current episode started 2 days ago. The problem has not changed since onset.Pertinent negatives include no chest pain, no abdominal pain, no headaches and no shortness of breath. Associated symptoms comments: No fever, no dysuria. She feels she needed to go to the bathroom frequently to urinate but she will only have few urine out. She used an OTC cystex medication about 6 hours ago with no improvement. She did some urine test at home with OTC test strip which suggested possible infection. Nothing aggravates the symptoms. Nothing relieves the symptoms.    Past Medical History  Diagnosis Date  . Hx of varicella   . Abnormal Pap smear     Over 10 years ago per pt, pre cancerous cells were removed.    Past Surgical History  Procedure Laterality Date  . Dilation and curettage of uterus  2005    In [place]  . Tonsillectomy    . Turbinate resection  2004  . Nasal septum surgery  2004   Family History  Problem Relation Age of Onset  . Hypertension Mother   . Other Father     shot and killed age 49  . Heart attack Maternal Aunt    Social History  Substance Use Topics  . Smoking status: Never Smoker   . Smokeless tobacco: Never Used  . Alcohol Use: Yes     Comment: socially   OB History    Gravida Para Term Preterm AB TAB SAB Ectopic Multiple Living   1    1  1    0     Review of Systems  Respiratory: Negative.  Negative for shortness of breath.   Cardiovascular: Negative for chest pain.  Gastrointestinal: Negative for abdominal pain.   Genitourinary: Positive for frequency.  Neurological: Negative for headaches.  All other systems reviewed and are negative.   Allergies  Penicillins  Home Medications   Prior to Admission medications   Medication Sig Start Date End Date Taking? Authorizing Provider  acetaZOLAMIDE (DIAMOX) 500 MG capsule  12/17/13  Yes Historical Provider, MD  azithromycin (ZITHROMAX) 250 MG tablet Take 2 tablet today then 1 tablet daily for 4 days 01/07/15   Kinnie Feil, MD   Meds Ordered and Administered this Visit  Medications - No data to display  BP 144/84 mmHg  Pulse 78  Temp(Src) 98.9 F (37.2 C) (Oral)  Resp 16  SpO2 100%  LMP 07/09/2015 (Exact Date) No data found.   Physical Exam  Constitutional: She appears well-developed. No distress.  Cardiovascular: Normal rate, regular rhythm and normal heart sounds.   No murmur heard. Pulmonary/Chest: Effort normal and breath sounds normal. No respiratory distress. She has no wheezes.  Abdominal: Soft. Bowel sounds are normal. She exhibits no distension and no mass. There is no tenderness.  Nursing note and vitals reviewed.   ED Course  Procedures (including critical care time)  Labs Review Labs Reviewed  POCT URINALYSIS DIP (DEVICE) - Abnormal; Notable for the following:    Hgb urine dipstick TRACE (*)    All other components within normal limits  POCT  PREGNANCY, URINE    Imaging Review No results found.   Visual Acuity Review  Right Eye Distance:   Left Eye Distance:   Bilateral Distance:    Right Eye Near:   Left Eye Near:    Bilateral Near:      Urinalysis    Component Value Date/Time   COLORURINE yellow 11/13/2007 1048   APPEARANCEUR Clear 11/13/2007 1048   LABSPEC 1.020 07/23/2015 1923   PHURINE 7.0 07/23/2015 1923   GLUCOSEU NEGATIVE 07/23/2015 1923   HGBUR TRACE* 07/23/2015 1923   HGBUR negative 11/13/2007 1048   BILIRUBINUR NEGATIVE 07/23/2015 1923   BILIRUBINUR negative 01/22/2012 1443    [place] 07/23/2015 1923   PROTEINUR NEGATIVE 07/23/2015 1923   PROTEINUR negative 01/22/2012 1443   UROBILINOGEN 0.2 07/23/2015 1923   UROBILINOGEN negative 01/22/2012 1443   NITRITE NEGATIVE 07/23/2015 1923   NITRITE negative 01/22/2012 1443   LEUKOCYTESUR NEGATIVE 07/23/2015 1923       MDM  No diagnosis found. Urine frequency  Patient reassured that her UA looks negative for infection, however she is worried she still have infection. Urine sent to lab for culture. Ciprofloxacin script given, she is advised to pick script up if no symptoms relieve or worsening. Return precaution discussed.    Kinnie Feil, MD 07/23/15 [phone]

## 2015-07-23 NOTE — Discharge Instructions (Signed)
Urinary Frequency °The number of times a normal person urinates depends upon how much liquid they take in and how much liquid they are losing. If the temperature is hot and there is high humidity, then the person will sweat more and usually breathe a little more frequently. These factors decrease the amount of frequency of urination that would be considered normal. °The amount you drink is easily determined, but the amount of fluid lost is sometimes more difficult to calculate.  °Fluid is lost in two ways: °· Sensible fluid loss is usually measured by the amount of urine that you get rid of. Losses of fluid can also occur with diarrhea. °· Insensible fluid loss is more difficult to measure. It is caused by evaporation. Insensible loss of fluid occurs through breathing and sweating. It usually ranges from a little less than a quart to a little more than a quart of fluid a day. °In normal temperatures and activity levels, the average person may urinate 4 to 7 times in a 24-hour period. Needing to urinate more often than that could indicate a problem. If one urinates 4 to 7 times in 24 hours and has large volumes each time, that could indicate a different problem from one who urinates 4 to 7 times a day and has small volumes. The time of urinating is also important. Most urinating should be done during the waking hours. Getting up at night to urinate frequently can indicate some problems. °CAUSES  °The bladder is the organ in your lower abdomen that holds urine. Like a balloon, it swells some as it fills up. Your nerves sense this and tell you it is time to head for the bathroom. There are a number of reasons that you might feel the need to urinate more often than usual. They include: °· Urinary tract infection. This is usually associated with other signs such as burning when you urinate. °· In men, problems with the prostate (a walnut-size gland that is located near the tube that carries urine out of your body). There  are two reasons why the prostate can cause an increased frequency of urination: °¨ An enlarged prostate that does not let the bladder empty well. If the bladder only half empties when you urinate, then it only has half the capacity to fill before you have to urinate again. °¨ The nerves in the bladder become more hypersensitive with an increased size of the prostate even if the bladder empties completely. °· Pregnancy. °· Obesity. Excess weight is more likely to cause a problem for women than for men. °· Bladder stones or other bladder problems. °· Caffeine. °· Alcohol. °· Medications. For example, drugs that help the body get rid of extra fluid (diuretics) increase urine production. Some other medicines must be taken with lots of fluids. °· Muscle or nerve weakness. This might be the result of a spinal cord injury, a stroke, multiple sclerosis, or Parkinson disease. °· Long-standing diabetes can decrease the sensation of the bladder. This loss of sensation makes it harder to sense the bladder needs to be emptied. Over a period of years, the bladder is stretched out by constant overfilling. This weakens the bladder muscles so that the bladder does not empty well and has less capacity to fill with new urine. °· Interstitial cystitis (also called painful bladder syndrome). This condition develops because the tissues that line the inside of the bladder are inflamed (inflammation is the body's way of reacting to injury or infection). It causes pain and frequent   urination. It occurs in women more often than in men. °DIAGNOSIS  °· To decide what might be causing your urinary frequency, your health care provider will probably: °¨ Ask about symptoms you have noticed. °¨ Ask about your overall health. This will include questions about any medications you are taking. °¨ Do a physical examination. °· Order some tests. These might include: °¨ A blood test to check for diabetes or other health issues that could be contributing  to the problem. °¨ Urine testing. This could measure the flow of urine and the pressure on the bladder. °¨ A test of your neurological system (the brain, spinal cord, and nerves). This is the system that senses the need to urinate. °¨ A bladder test to check whether it is emptying completely when you urinate. °¨ Cystoscopy. This test uses a thin tube with a tiny camera on it. It offers a look inside your urethra and bladder to see if there are problems. °¨ Imaging tests. You might be given a contrast dye and then asked to urinate. X-rays are taken to see how your bladder is working. °TREATMENT  °It is important for you to be evaluated to determine if the amount or frequency that you have is unusual or abnormal. If it is found to be abnormal, the cause should be determined and this can usually be found out easily. Depending upon the cause, treatment could include medication, stimulation of the nerves, or surgery. °There are not too many things that you can do as an individual to change your urinary frequency. It is important that you balance the amount of fluid intake needed to compensate for your activity and the temperature. Medical problems will be diagnosed and taken care of by your physician. There is no particular bladder training such as Kegel exercises that you can do to help urinary frequency. This is an exercise that is usually recommended for people who have leaking of urine when they laugh, cough, or sneeze. °HOME CARE INSTRUCTIONS  °· Take any medications your health care provider prescribed or suggested. Follow the directions carefully. °· Practice any lifestyle changes that are recommended. These might include: °¨ Drinking less fluid or drinking at different times of the day. If you need to urinate often during the night, for example, you may need to stop drinking fluids early in the evening. °¨ Cutting down on caffeine or alcohol. They both can make you need to urinate more often than normal. Caffeine  is found in coffee, tea, and sodas. °¨ Losing weight, if that is recommended. °· Keep a journal or a log. You might be asked to record how much you drink and when and where you feel the need to urinate. This will also help evaluate how well the treatment provided by your physician is working. °SEEK MEDICAL CARE IF:  °· Your need to urinate often gets worse. °· You feel increased pain or irritation when you urinate. °· You notice blood in your urine. °· You have questions about any medications that your health care provider recommended. °· You notice blood, pus, or swelling at the site of any test or treatment procedure. °· You develop a fever of more than 100.5°F (38.1°C). °SEEK IMMEDIATE MEDICAL CARE IF:  °You develop a fever of more than 102.0°F (38.9°C). °  °This information is not intended to replace advice given to you by your health care provider. Make sure you discuss any questions you have with your health care provider. °  °Document Released: 12/01/2008 Document Revised:   02/25/2014 Document Reviewed: 12/01/2008 °Elsevier Interactive Patient Education ©2016 [employer] ° °

## 2015-07-23 NOTE — ED Notes (Signed)
The patient presented to the [facility] with a complaint of urinary frequency that started 2 days ago.

## 2015-07-25 LAB — URINE CULTURE

## 2015-07-26 ENCOUNTER — Telehealth (HOSPITAL_COMMUNITY): Payer: Self-pay | Admitting: Emergency Medicine

## 2015-07-26 ENCOUNTER — Telehealth: Payer: Self-pay | Admitting: Internal Medicine

## 2015-07-26 NOTE — Telephone Encounter (Signed)
[facility] - Client  [place]    --------------------------------------------------------------------------------   Patient Name: Kristin Harper  Gender: Female  DOB: 1975-04-10   Age: 40 Y 10 M 12 D  Return Phone Number: [phone] (Primary), [phone] (Alternate)  Address: [address]    City/State/Zip: [place]  [address]   Client [facility] - Client  Client Site [facility] - Day  Physician Shanon Ace - MD  Contact Type Call  Who Is Calling Patient / Member / Family / Caregiver  Call Type Triage / Clinical  Relationship To Patient Self  Return Phone Number [phone] (Primary)  Chief Complaint Urination Frequency  Reason for Call Symptomatic / Request for Health Information  Initial Comment Caller states she has been experiencing frequent urination since Saturday. She also has been having issues with her side hurting. She went to the urgent care and did some tests and she didn't have a UTI.  Appointment Disposition EMR Appointment Attempted - Not Scheduled  Info pasted into Epic Yes  PreDisposition Go to Urgent Care/Walk-In Clinic  Translation No       Nurse Assessment  Nurse: Venetia Maxon, RN, Manuela Schwartz Date/Time (Eastern Time): 07/26/2015 4:01:57 PM  Confirm and document reason for call. If symptomatic, describe symptoms. You must click the next button to save text entered. ---Caller states she has been experiencing frequent urination since Saturday. She also has been having issues with her side hurting. She went to the urgent care and did some tests and she didn't have a UTI. ( she is not pregant) The left side hurts . began Monday. Dr at [facility] did pregnancy test : neg. She did get the RX abx It was Ciprofloxacin. 3 day course . The side is not hurting now. 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 she wonders if that  med is causing UTI symptoms. She was dxd by an Probation officer . She has had UTI years ago. normally she goes to UC . last PCP visit 2015    Has the patient traveled out of the country within the last 30 days? ---No    Does the patient have any new or worsening symptoms? ---Yes    Will a triage be completed? ---Yes    Related visit to physician within the last 2 weeks? ---Yes    Does the PT have any chronic conditions? (i.e. diabetes, asthma, etc.) ---Yes    List chronic conditions. ---pseudotumor cerebri pap smears are neg    Is the patient pregnant or possibly pregnant? (Ask all females between the ages of 2-55) ---No    Is this a behavioral health or substance abuse call? ---No           Guidelines          Guideline Title Affirmed Question Affirmed Notes Nurse Date/Time Eilene Ghazi Time)  Urinary Tract Infection on Antibiotic Follow-up Call - Female [1] Side (flank) or lower back pain AND [2] new onset since starting antibiotics    Venetia Maxon, RN, Manuela Schwartz 07/26/2015 4:06:48 PM    Disp. Time Eilene Ghazi Time) Disposition Final User         07/26/2015 4:11:20 PM See Physician within 4 Hours (or PCP triage) Yes Venetia Maxon, RN, Edwena Bunde Understands: Yes  Disagree/Comply: Comply       Care Advice Given Per Guideline  SEE PHYSICIAN WITHIN 4 HOURS (or PCP triage): CALL BACK IF: * You become worse. CARE ADVICE given per Urinary Tract Infection on Antibiotic Follow-Up Call, Female (Adult) guideline.        --------------------------------------------------------------------------------            Referrals  GO TO FACILITY OTHER - SPECIFY

## 2015-07-26 NOTE — Telephone Encounter (Signed)
Pt scheduled to see Dr. Martinique on 07/27/15

## 2015-07-26 NOTE — ED Notes (Signed)
Called pt and notified of recent lab results from visit 6/4 Pt ID'd properly... Reports she's still having pain and discomfort... Reports she will make appt w/PCP or Urologist.   Per Dr. Valere Dross,  Notes Recorded by Sherlene Shams, MD on 07/26/2015 at 2:42 AM Please let patient know that urine culture does not clearly demonstrate a UTI. Recheck for further evaluation if symptoms persist. LM  Adv pt if sx are not getting better to return  Pt verb understanding

## 2015-07-27 ENCOUNTER — Encounter: Payer: Self-pay | Admitting: Family Medicine

## 2015-07-27 ENCOUNTER — Ambulatory Visit (INDEPENDENT_AMBULATORY_CARE_PROVIDER_SITE_OTHER): Payer: BC Managed Care – PPO | Admitting: Family Medicine

## 2015-07-27 ENCOUNTER — Encounter: Payer: Self-pay | Admitting: *Deleted

## 2015-07-27 VITALS — BP 110/75 | HR 87 | Temp 98.2°F | Resp 12 | Ht 66.0 in | Wt 283.0 lb

## 2015-07-27 DIAGNOSIS — Z6841 Body Mass Index (BMI) 40.0 and over, adult: Secondary | ICD-10-CM | POA: Diagnosis not present

## 2015-07-27 DIAGNOSIS — R350 Frequency of micturition: Secondary | ICD-10-CM | POA: Diagnosis not present

## 2015-07-27 LAB — POCT URINALYSIS DIPSTICK
BILIRUBIN UA: NEGATIVE
GLUCOSE UA: NEGATIVE
Ketones, UA: NEGATIVE
LEUKOCYTES UA: NEGATIVE
NITRITE UA: NEGATIVE
PH UA: 8
PROTEIN UA: NEGATIVE
Spec Grav, UA: 1.015
Urobilinogen, UA: 0.2

## 2015-07-27 LAB — POCT GLYCOSYLATED HEMOGLOBIN (HGB A1C): HEMOGLOBIN A1C: 5.6

## 2015-07-27 NOTE — Patient Instructions (Addendum)
A few things to remember from today's visit:   1. Urine frequency  - POC Urinalysis Dipstick - POC HgB A1c: 5.6, no diabetes. Healthy diet and regular physical activity for prevention.   2. BMI 45.0-49.9, adult [facility])  What are some tips for weight loss?  People become overweight for many reasons. Weight issues can run in families. They can be caused by unhealthy behaviors and a person's environment. Certain health problems and medicines can also lead to weight gain. There are some simple things you can do to reach and maintain a healthy weight:  Eat 500 fewer calories per day than your body needs to maintain your weight. Women should aim for no more than 1,200 to 1,500 calories per day. Men should aim for 1,500 to 1,800 calories per day. Avoid sweet drinks. These include regular soft drinks, fruit juices, fruit drinks, energy drinks, sweetened iced tea, and flavored milk. Avoid fast foods. Fast foods such as french fries, hamburgers, chicken nuggets, and pizza are high in calories and can cause weight gain. Eat a healthy breakfast. People who skip breakfast tend to weigh more. Don't watch more than two hours of television per day. Chew sugar-free gum between meals to cut down on snacking. Avoid grocery shopping when you're hungry. Pack a healthy lunch instead of eating out to control what and how much you eat. Eat a lot of fruits and vegetables. Aim for about 2 cups of fruit and 2 to 3 cups of vegetables per day. Aim for 150 minutes per week of moderate-intensity exercise (such as brisk walking), or 75 minutes per week of vigorous exercise (such as jogging or running). Be more active. Small changes in physical activity can easily be added to your daily routine. For example, take the stairs instead of the elevator. Take a walk with your family. A daily walk is a great way to get exercise and to catch up on the day's events.

## 2015-07-27 NOTE — Progress Notes (Signed)
HPI:   [name] is a 40 y.o. female, who is here today complaining of 5 of urinary symptoms.   Positive: urinary frequency, and urgency. Negative for fever,chills, abdominal pain, dysuria, gross hematuria, or skin rash. Denies incontinence or nocturia.  She states that a couple times she has felt some "side" pain, points to left flank, "not unbearable", just "discomfort", she states that it is hard to explained. Seems to be exacerbated by prolonged standing and better when lying down. No radiated. Also yesterday she felt a right groin pain, like a "wave", lasted seconds.  She was seen for same symptoms on 07/23/15, urine dip otherwise negative except for trace of blood. Still Cipro Rx was given, she took 3 tabs and discontinued after she learned about Ucx results.She was also afraid of side effects, none reported.   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 She is concerned about this medication be causing "bladder issues", she wonders if she needs to see an urologist.  She has not noted vaginal discharge, pruritus,bleeding.  LMP today.   Sexually active: Yes, not on birth control, reports Hx of PCOS. She follows with gyn for ehr routine female care, last visit 3-4 months ago, Dx with small fibroid. She is also concerned about DM, her father has Hx of DM II. She is not exercising regularly and does not follow a specific diet.    Review of Systems  Constitutional: Negative for fever, activity change, appetite change, fatigue and unexpected  weight change.  HENT: Negative for facial swelling, nosebleeds and sore throat.   Respiratory: Negative for cough, shortness of breath and wheezing.   Cardiovascular: Negative for chest pain and leg swelling.  Gastrointestinal: Negative for nausea, vomiting, abdominal pain, diarrhea and constipation.  Endocrine: Negative for polydipsia and polyphagia.  Genitourinary: Positive for urgency and frequency. Negative for dysuria, hematuria, decreased urine volume, vaginal discharge, menstrual problem and pelvic pain.  Musculoskeletal: Positive for back pain. Negative for myalgias.  Skin: Negative for color change and rash.  Neurological: Negative for syncope, weakness, numbness and headaches.  Hematological: Negative for adenopathy. Does not bruise/bleed easily.  Psychiatric/Behavioral: Negative for behavioral problems and sleep disturbance.      Current Outpatient Prescriptions on File Prior to Visit  Medication Sig Dispense Refill  . acetaZOLAMIDE (DIAMOX) 500 MG capsule   3  . ciprofloxacin (CIPRO) 250 MG tablet Take 1 tablet (250 mg total) by mouth 2 (two) times daily. Use only if symptoms persist. (Patient not taking: Reported on 07/27/2015) 6 tablet 0   No current facility-administered medications on file prior to visit.     Past Medical History  Diagnosis Date  . Hx of varicella   . Abnormal Pap smear     Over 10 years ago per pt, pre cancerous cells were removed.    Allergies  Allergen Reactions  . Penicillins Hives and Rash    Social History   Social History  . Marital Status: Single    Spouse Name: N/A  .  Number of Children: N/A  . Years of Education: N/A   Social History Main Topics  . Smoking status: Never Smoker   . Smokeless tobacco: Never Used  . Alcohol Use: Yes     Comment: socially  . Drug Use: No  . Sexual Activity:    Partners: Male    Birth Control/ Protection: Condom   Other Topics Concern  . None   Social History Narrative   g 1 p 0    No  tobacco    internet based business   Works at [employer]:   07/27/15 1604  BP: 110/75  Pulse: 87  Temp: 98.2 F (36.8 C)  Resp: 12   Body mass index is 45.7 kg/(m^2).  SpO2 Readings from Last 3 Encounters:  07/27/15 98%  07/23/15 100%  01/07/15 98%      Physical Exam  Constitutional: She is oriented to person, place, and time. She appears well-developed. She does not appear ill. No distress.  HENT:  Head: Atraumatic.  Mouth/Throat: Oropharynx is clear and moist and mucous membranes are normal.  Eyes: Conjunctivae and EOM are normal.  Cardiovascular: Normal rate and regular rhythm.   Respiratory: Effort normal and breath sounds normal. No respiratory distress.  GI: Soft. She exhibits no mass. There is no tenderness. There is no CVA tenderness. No hernia (I do not appreciated any hernia defect with valsalva when lying down or standing up).  Musculoskeletal: She exhibits no edema or tenderness.  No significant deformity appreciated. no tenderness upon palpation of paraspinal muscles. Pain is not elicited with movement on exam table during examination.   Lymphadenopathy:    She has no cervical adenopathy.       Right: No supraclavicular adenopathy present.       Left: No supraclavicular adenopathy present.  Neurological: She is alert and oriented to person, place, and time. She has normal strength. Gait normal.  Skin: Skin is warm. No erythema.  Psychiatric: Her speech is normal. Her mood appears anxious.  Well groomed, good eye contact.      ASSESSMENT AND PLAN:     Kristin Harper was seen today for urinary frequency and flank pain.  Diagnoses and all orders for this visit:  Urine frequency  We discussed possible causes, urine dip today otherwise negative except for blood in urine( on menses). Recent Ucx not suggestive of UTI. Symptoms not very suggested of UTI. Certainly Diomax can cause polyuria but she denies any prior symptom and has been on this med  for 2 years. ? Hyperactive bladder, fibroid among some.  I offered a trial with Oxybutynin or Vesicare but she is not sure, afraid of possible side effects. She is really concerned about "bladder issues" related with Diamox, tried to reassure but given her concern urologists referral placed. I also suggested following with her gyn to re-evaluate fibroid.    -     POC Urinalysis Dipstick -     POC HgB A1c -     Ambulatory referral to Urology   BMI 45.0-49.9, adult [facility])  We discussed benefits of wt loss as well as adverse effects of obesity. Consistency with healthy diet and physical activity recommended. Portion controlled, frequent/amalls meals as well as daily brisk walking for 15-30 min as tolerated.      -She was advised to return or notify a doctor immediately if symptoms worsen or persist or new concerns arise, she voices understanding.       Jerett Odonohue G. [place], MD  [place]. [place] office.

## 2015-07-28 ENCOUNTER — Encounter (HOSPITAL_COMMUNITY): Payer: Self-pay | Admitting: Emergency Medicine

## 2015-07-28 ENCOUNTER — Emergency Department (HOSPITAL_COMMUNITY)
Admission: EM | Admit: 2015-07-28 | Discharge: 2015-07-28 | Disposition: A | Discharge status: Home / Self Care | Payer: BC Managed Care – PPO | Attending: Emergency Medicine | Admitting: Emergency Medicine

## 2015-07-28 DIAGNOSIS — Z3202 Encounter for pregnancy test, result negative: Secondary | ICD-10-CM | POA: Diagnosis not present

## 2015-07-28 DIAGNOSIS — Z8619 Personal history of other infectious and parasitic diseases: Secondary | ICD-10-CM | POA: Insufficient documentation

## 2015-07-28 DIAGNOSIS — R300 Dysuria: Secondary | ICD-10-CM | POA: Diagnosis present

## 2015-07-28 DIAGNOSIS — N3000 Acute cystitis without hematuria: Secondary | ICD-10-CM | POA: Diagnosis not present

## 2015-07-28 DIAGNOSIS — Z86018 Personal history of other benign neoplasm: Secondary | ICD-10-CM | POA: Insufficient documentation

## 2015-07-28 DIAGNOSIS — Z880 Allergy status to penicillin: Secondary | ICD-10-CM | POA: Insufficient documentation

## 2015-07-28 DIAGNOSIS — Z8719 Personal history of other diseases of the digestive system: Secondary | ICD-10-CM | POA: Insufficient documentation

## 2015-07-28 DIAGNOSIS — Z79899 Other long term (current) drug therapy: Secondary | ICD-10-CM | POA: Diagnosis not present

## 2015-07-28 HISTORY — DX: Benign neoplasm of connective and other soft tissue, unspecified: D21.9

## 2015-07-28 HISTORY — DX: Gastro-esophageal reflux disease without esophagitis: K21.9

## 2015-07-28 LAB — URINE MICROSCOPIC-ADD ON

## 2015-07-28 LAB — CBC WITH DIFFERENTIAL/PLATELET
Basophils Absolute: 0 10*3/uL (ref 0.0–0.1)
Basophils Relative: 0 %
Eosinophils Absolute: 0.2 10*3/uL (ref 0.0–0.7)
Eosinophils Relative: 3 %
HEMATOCRIT: 40.5 % (ref 36.0–46.0)
Hemoglobin: 12.3 g/dL (ref 12.0–15.0)
LYMPHS ABS: 2.4 10*3/uL (ref 0.7–4.0)
Lymphocytes Relative: 34 %
MCH: 20.4 pg — ABNORMAL LOW (ref 26.0–34.0)
MCHC: 30.4 g/dL (ref 30.0–36.0)
MCV: 67.1 fL — ABNORMAL LOW (ref 78.0–100.0)
MONOS PCT: 6 %
Monocytes Absolute: 0.4 10*3/uL (ref 0.1–1.0)
NEUTROS ABS: 4.1 10*3/uL (ref 1.7–7.7)
Neutrophils Relative %: 57 %
Platelets: 306 10*3/uL (ref 150–400)
RBC: 6.04 MIL/uL — ABNORMAL HIGH (ref 3.87–5.11)
RDW: 15.5 % (ref 11.5–15.5)
WBC: 7.1 10*3/uL (ref 4.0–10.5)

## 2015-07-28 LAB — COMPREHENSIVE METABOLIC PANEL
ALBUMIN: 3.5 g/dL (ref 3.5–5.0)
ALK PHOS: 48 U/L (ref 38–126)
ALT: 14 U/L (ref 14–54)
AST: 15 U/L (ref 15–41)
Anion gap: 5 (ref 5–15)
BILIRUBIN TOTAL: 0.6 mg/dL (ref 0.3–1.2)
BUN: 7 mg/dL (ref 6–20)
CO2: 23 mmol/L (ref 22–32)
CREATININE: 0.86 mg/dL (ref 0.44–1.00)
Calcium: 9.4 mg/dL (ref 8.9–10.3)
Chloride: 110 mmol/L (ref 101–111)
GFR calc Af Amer: >60 mL/min (ref >60)
GFR calc non Af Amer: >60 mL/min (ref >60)
GLUCOSE: 84 mg/dL (ref 65–99)
Potassium: 4 mmol/L (ref 3.5–5.1)
Sodium: 138 mmol/L (ref 135–145)
TOTAL PROTEIN: 7 g/dL (ref 6.5–8.1)

## 2015-07-28 LAB — PREGNANCY, URINE: Preg Test, Ur: NEGATIVE

## 2015-07-28 LAB — URINALYSIS, ROUTINE W REFLEX MICROSCOPIC
GLUCOSE, UA: NEGATIVE mg/dL
KETONES UR: 15 mg/dL — AB
Nitrite: POSITIVE — AB
PROTEIN: 100 mg/dL — AB
Specific Gravity, Urine: 1.025 (ref 1.005–1.030)
pH: 5 (ref 5.0–8.0)

## 2015-07-28 MED ORDER — CIPROFLOXACIN HCL 500 MG PO TABS: 500.0000 mg | ORAL_TABLET | Freq: Two times a day (BID) | ORAL | Status: DC

## 2015-07-28 NOTE — ED Notes (Signed)
Was seen last Sunday for frequency voiding and tests did not show UTI , given cipro rx took it for 2 1/2 days and then stopped thought it was reacting with another of her meds, diamox, saw her pcp yesterday, and ? See urologist. Pt states her lower abd feels heavy and she started her period yesterday, today she feels like she needs xray

## 2015-07-28 NOTE — ED Provider Notes (Signed)
CSN: [id number]     Arrival date & time 07/28/15  [id number] History   First MD Initiated Contact with Patient 07/28/15 [id number]     Chief Complaint  Patient presents with  . Dysuria     (Consider location/radiation/quality/duration/timing/severity/associated sxs/prior Treatment) HPI   Kristin Harper is a 40 y.o. female, with a history of uterine fibroids, presenting to the ED with pelvic discomfort and a feeling of urinary retention for the last week. Pt describes a feeling of "heaviness and pressure" in her lower pelvis. Pt can still urinate, but immediately afterward feels as though she has to urinate again. The discomfort is present all the time. Pt was seen at UC, a UA was performed, Which showed no evidence of infection. Urinary culture confirmed this. Patient was placed on Cipro at that time, but stopped after 3 doses once she learned of the culture results. Patient was seen at her PCPs office yesterday, tested for things such as diabetes, and given a urology referral. Patient is concerned that no imaging has been done. Patient began her menstrual cycle yesterday. She adds that she usually has a fairly heavy flow, but only has small drops on her pad. When she sits down to use the bathroom, pt then states she has a "gush" of blood. Patient denies flank pain, hematuria, abnormal vaginal discharge, fever/chills, nausea/vomiting, or any other complaints. Patient has never delivered a child.     Past Medical History  Diagnosis Date  . Hx of varicella   . Abnormal Pap smear     Over 10 years ago per pt, pre cancerous cells were removed.   . Acid reflux   . Fibroid    Past Surgical History  Procedure Laterality Date  . Dilation and curettage of uterus  2005    In [place]  . Tonsillectomy    . Turbinate resection  2004  . Nasal septum surgery  2004   Family History  Problem Relation Age of Onset  . Hypertension Mother   . Other Father     shot and killed age 67  . Heart attack Maternal Aunt     Social History  Substance Use Topics  . Smoking status: Never Smoker   . Smokeless tobacco: Never Used  . Alcohol Use: Yes     Comment: socially   OB History    Gravida Para Term Preterm AB TAB SAB Ectopic Multiple Living   1    1  1    0     Review of Systems  Constitutional: Negative for fever and chills.  Gastrointestinal: Negative for nausea, vomiting, abdominal pain, diarrhea and constipation.  Genitourinary: Positive for urgency, frequency, decreased urine volume, difficulty urinating and pelvic pain. Negative for hematuria, flank pain and vaginal discharge.  Musculoskeletal: Negative for back pain.  Skin: Negative for color change and pallor.  Neurological: Negative for dizziness and light-headedness.  All other systems reviewed and are negative.     Allergies  Penicillins  Home Medications   Prior to Admission medications   Medication Sig Start Date End Date Taking? Authorizing Provider  acetaZOLAMIDE (DIAMOX) 500 MG capsule Take 500 mg by mouth daily.  12/17/13  Yes Historical Provider, MD  ciprofloxacin (CIPRO) 500 MG tablet Take 1 tablet (500 mg total) by mouth 2 (two) times daily. 07/28/15   [place] C Ilo Beamon, PA-C   BP 126/93 mmHg  Pulse 70  Temp(Src) 98.5 F (36.9 C) (Oral)  Resp 14  SpO2 100%  LMP 07/27/2015 Physical Exam  Constitutional: She  appears well-developed and well-nourished. No distress.  HENT:  Head: Normocephalic and atraumatic.  Eyes: Conjunctivae are normal.  Neck: Neck supple.  Cardiovascular: Normal rate, regular rhythm and intact distal pulses.   Pulmonary/Chest: Effort normal. No respiratory distress.  Abdominal: Soft. There is no tenderness. There is no guarding.  Genitourinary:  External genitalia normal Vagina with discharge - bright red blood consistent with menses. Anterior vaginal wall is somewhat relaxed, but no definite finding consistent with prolapse. Cervix  normal negative for cervical motion tenderness Adnexa palpated, no  masses or negative for tenderness noted Bladder palpated negative for tenderness Uterus palpated no masses or negative for tenderness Otherwise normal female genitalia. [place], [place], served as Producer, television/film/video during exam.  Musculoskeletal: She exhibits no edema or tenderness.  Lymphadenopathy:    She has no cervical adenopathy.  Neurological: She is alert.  Skin: Skin is warm and dry. She is not diaphoretic.  Psychiatric: She has a normal mood and affect. Her behavior is normal.  Nursing note and vitals reviewed.   ED Course  Pelvic exam Date/Time: 07/28/2015 12:20 PM Performed by: Lorayne Bender Authorized by: Arlean Hopping C   (including critical care time) Labs Review Labs Reviewed  URINALYSIS, ROUTINE W REFLEX MICROSCOPIC (NOT AT [facility]) - Abnormal; Notable for the following:    Color, Urine RED (*)    APPearance TURBID (*)    Hgb urine dipstick LARGE (*)    Bilirubin Urine MODERATE (*)    Ketones, ur 15 (*)    Protein, ur 100 (*)    Nitrite POSITIVE (*)    Leukocytes, UA MODERATE (*)    All other components within normal limits  URINE MICROSCOPIC-ADD ON - Abnormal; Notable for the following:    Squamous Epithelial / LPF 0-5 (*)    Bacteria, UA MANY (*)    All other components within normal limits  CBC WITH DIFFERENTIAL/PLATELET - Abnormal; Notable for the following:    RBC 6.04 (*)    MCV 67.1 (*)    MCH 20.4 (*)    All other components within normal limits  URINE CULTURE  PREGNANCY, URINE  COMPREHENSIVE METABOLIC PANEL    Imaging Review No results found. I have personally reviewed and evaluated these lab results as part of my medical decision-making.   EKG Interpretation None      MDM   Final diagnoses:  Acute cystitis without hematuria    Shantavious Alger presents with feeling of pelvic heaviness and discomfort along with multiple urinary complaints for the last week.  Findings and plan of care discussed with Forde Dandy, MD. Dr. Oleta Mouse personally evaluated  and examined this patient.  Although patient did not have evidence of UTI on the previous UA, she now shows evidence of that UTI. Patient's story is suspicious for a possible bladder prolapse. PVR 170 mL. No evidence of prolapse on pelvic exam. Plan to treat the UTI and, should symptoms continue, patient to be referred to urology and instructed to see her OB/GYN, Dr. Leo Grosser. Home care and return precautions discussed. Patient voiced understanding of these instructions, accepts the plan, and is comfortable with discharge.  Filed Vitals:   07/28/15 0851 07/28/15 1106 07/28/15 1206  BP: 147/88 127/74 126/93  Pulse: 98 78 70  Temp: 98.5 F (36.9 C)    TempSrc: Oral    Resp: 16 14 14   SpO2: 100% 100% 100%   Filed Vitals:   07/28/15 1200 07/28/15 1206 07/28/15 1215 07/28/15 1351  BP:  126/93 116/71 118/78  Pulse: 71 70 73 75  Temp:    98.1 F (36.7 C)  TempSrc:    Oral  Resp:  14  18  SpO2: 99% 100% 100% 99%     Lorayne Bender, PA-C 07/28/15 [place] Liu, MD 07/28/15 1810

## 2015-07-28 NOTE — ED Notes (Signed)
Post Void Residual 170.

## 2015-07-28 NOTE — Discharge Instructions (Signed)
You have been seen today for urinary and pelvic discomfort.  1. The urinalysis showed evidence of a UTI. You will be prescribed Cipro for this UTI. Please take all of your antibiotics until finished!   You may develop abdominal discomfort or diarrhea from the antibiotic.  You may help offset this with probiotics which you can buy or get in yogurt. Do not eat or take the probiotics until 2 hours after your antibiotic. 2. Should symptoms continue after the antibiotic, make an appointment with Dr. Noland Fordyce office using the number provided.  3. Follow up with OBGYN should the symptoms of fullness and heaviness continue to be checked for a possible prolapse.  4. Follow up with PCP as needed.  5. Return to ED should symptoms worsen.

## 2015-07-29 LAB — URINE CULTURE: Culture: NO GROWTH

## 2015-08-02 ENCOUNTER — Other Ambulatory Visit: Payer: Self-pay | Admitting: Obstetrics and Gynecology

## 2015-08-02 DIAGNOSIS — R319 Hematuria, unspecified: Secondary | ICD-10-CM

## 2015-08-04 ENCOUNTER — Ambulatory Visit
Admission: RE | Admit: 2015-08-04 | Discharge: 2015-08-04 | Disposition: A | Discharge status: Home / Self Care | Payer: BC Managed Care – PPO | Source: Ambulatory Visit | Attending: Obstetrics and Gynecology | Admitting: Obstetrics and Gynecology

## 2015-08-04 DIAGNOSIS — R319 Hematuria, unspecified: Secondary | ICD-10-CM

## 2015-08-10 ENCOUNTER — Other Ambulatory Visit: Payer: BC Managed Care – PPO

## 2015-08-11 ENCOUNTER — Encounter: Payer: Self-pay | Admitting: Internal Medicine

## 2015-08-11 ENCOUNTER — Ambulatory Visit (INDEPENDENT_AMBULATORY_CARE_PROVIDER_SITE_OTHER): Payer: BC Managed Care – PPO | Admitting: Internal Medicine

## 2015-08-11 VITALS — BP 130/90 | Temp 99.1°F | Wt 278.6 lb

## 2015-08-11 DIAGNOSIS — L0291 Cutaneous abscess, unspecified: Secondary | ICD-10-CM | POA: Diagnosis not present

## 2015-08-11 DIAGNOSIS — L0390 Cellulitis, unspecified: Secondary | ICD-10-CM | POA: Diagnosis not present

## 2015-08-11 MED ORDER — DOXYCYCLINE HYCLATE 100 MG PO TABS: 100.0000 mg | ORAL_TABLET | Freq: Two times a day (BID) | ORAL | Status: DC

## 2015-08-11 NOTE — Patient Instructions (Signed)
This is a skin abscess.  It is good that it is draining and   We cultured the pus  Today.  Warm compresses or warm moist heat baths to help it continue to drain.  At least 3 x per day but  More often is better .  dont burn skin.   Oral antibiotic also .  ROV in  A week or earlier if needed     Note for work    Can return Monday if  Getting better  With accomodation . Contact us  If fever .  Sometimes   Need  A more complex drainage through surgeon if  Not getting better .

## 2015-08-11 NOTE — Progress Notes (Signed)
Pre visit review using our clinic review tool, if applicable. No additional management support is needed unless otherwise documented below in the visit note.  Chief Complaint  Patient presents with  . Boil    HPI: Kristin Harper 40 y.o.   Saturday  Last week painful and did tartopical cream  And getting worse  And more than oince boil an burst this am    '  Some pus on one    .  Running  Cream color with blood .  No fever or shaking chills. Has had boils before but no large area. Missed 3 days of work this week. In the past month has had UTIs other symptoms hasn't been on an antibiotic and a week or 2 treated with Cipro. No known history of recurrent staph infections. ROS: See pertinent positives and negatives per HPI.  Past Medical History  Diagnosis Date  . Hx of varicella   . Abnormal Pap smear     Over 10 years ago per pt, pre cancerous cells were removed.   . Acid reflux   . Fibroid     Family History  Problem Relation Age of Onset  . Hypertension Mother   . Other Father     shot and killed age 19  . Heart attack Maternal Aunt     Social History   Social History  . Marital Status: Single    Spouse Name: N/A  . Number of Children: N/A  . Years of Education: N/A   Social History Main Topics  . Smoking status: Never Smoker   . Smokeless tobacco: Never Used  . Alcohol Use: Yes     Comment: socially  . Drug Use: No  . Sexual Activity:    Partners: Male    Birth Control/ Protection: Condom   Other Topics Concern  . Not on file   Social History Narrative   g 1 p 0    No tobacco    internet based business   Works at [employer] Prior to Visit  Medication Sig Dispense Refill  . acetaZOLAMIDE (DIAMOX) 500 MG capsule Take 500 mg by mouth daily.   3  . ciprofloxacin (CIPRO) 500 MG tablet Take 1 tablet (500 mg total) by mouth 2 (two) times daily. 14 tablet 0   No facility-administered medications prior to visit.     EXAM:  BP  130/90 mmHg  Temp(Src) 99.1 F (37.3 C) (Oral)  Wt 278 lb 9.6 oz (126.372 kg)  LMP 07/27/2015  Body mass index is 44.99 kg/(m^2).  GENERAL: vitals reviewed and listed above, alert, oriented, appears well hydrated and in no acute distress HEENT: atraumatic, conjunctiva  clear, no obvious abnormalities on inspection of external nose and ears  Examination left upper thigh crease at her buttocks area. There is a large reddened area with indistinct borders about 5 cm with the center 2 cm bump with a draining opening into papules next  to it. Discharge is cloudy yellow gray. Slight bloody tinge. The stump was cultured and expression of significant amount of pus was milked. No other tracts decision made not to do further incision and drainage because this appeared to be draining well. Area was smaller and induration when patient left the office. Covered with instructions. MS: moves all extremities without noticeable focal  abnormality PSYCH: pleasant and cooperative, no obvious depression or anxiety  ASSESSMENT AND PLAN:  Discussed the following assessment and plan:  Cellulitis and abscess - Plan: WOUND CULTURE Discussed  infection abscess causes. Encouraged her that continued draining is a good sign as long as improving. Add empiric antibiotic she's allergic to penicillin had a rash when she was a child. Continued warm compresses bad sketching about how to make it drain and improved. It is getting worse may need surgical intervention further evaluation. Get him scheduled for next week for 7 days can cancel if doing well. We'll contact when we get culture results back. Note for work -Patient advised to return or notify health care team  if symptoms worsen ,persist or new concerns arise.  Patient Instructions  This is a skin abscess.  It is good that it is draining and   We cultured the pus  Today.  Warm compresses or warm moist heat baths to help it continue to drain.  At least 3 x per day but   More often is better .  dont burn skin.   Oral antibiotic also .  ROV in  A week or earlier if needed     Note for work    Can return Monday if  Getting better  With accomodation . Contact us  If fever .  Sometimes   Need  A more complex drainage through surgeon if  Not getting better .      Standley Brooking. Panosh M.D.

## 2015-08-14 ENCOUNTER — Telehealth: Payer: Self-pay | Admitting: Internal Medicine

## 2015-08-14 LAB — WOUND CULTURE
Gram Stain: NONE SEEN
Gram Stain: NONE SEEN
Gram Stain: NONE SEEN
Organism ID, Bacteria: NO GROWTH

## 2015-08-14 MED ORDER — CEPHALEXIN 500 MG PO CAPS: 500.0000 mg | ORAL_CAPSULE | Freq: Three times a day (TID) | ORAL | Status: DC

## 2015-08-14 NOTE — Telephone Encounter (Signed)
Spoke to the pt.  She stated she had body aches, headache and two loose stools.  Pt notified that Keflex has been sent to the pharmacy.

## 2015-08-14 NOTE — Telephone Encounter (Signed)
Pt states the  doxycycline (VIBRA-TABS) 100 MG tablet   did not agree with her. Pt states it gave her flu like symptoms, dry mouth, Upset stomach, headache. 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 Would like another abx [facility] [address]

## 2015-08-14 NOTE — Telephone Encounter (Signed)
I agree with not taking   The doxy. Ask her more specifics about the reaction she had with pcn   Most of the time it is safe to take keflex I would rive  Even with  Possible theoretical cross reaction .   Keflex 500 mg 1 tid for 7 days

## 2015-08-16 ENCOUNTER — Encounter: Payer: Self-pay | Admitting: Family Medicine

## 2015-08-16 ENCOUNTER — Ambulatory Visit (INDEPENDENT_AMBULATORY_CARE_PROVIDER_SITE_OTHER): Payer: BC Managed Care – PPO | Admitting: Internal Medicine

## 2015-08-16 ENCOUNTER — Encounter: Payer: Self-pay | Admitting: Internal Medicine

## 2015-08-16 VITALS — BP 120/70 | Temp 98.2°F | Wt 283.1 lb

## 2015-08-16 DIAGNOSIS — L02419 Cutaneous abscess of limb, unspecified: Secondary | ICD-10-CM | POA: Diagnosis not present

## 2015-08-16 DIAGNOSIS — L03119 Cellulitis of unspecified part of limb: Secondary | ICD-10-CM

## 2015-08-16 NOTE — Patient Instructions (Addendum)
Looks about 70 % better   Still draining .  Stay on antibiotic  If not a lot better to continue  And draining next week at end of antibiotic  Then recheck here.  If urine concern of fever  We can recheck that

## 2015-08-16 NOTE — Progress Notes (Signed)
Pre visit review using our clinic review tool, if applicable. No additional management support is needed unless otherwise documented below in the visit note.  Chief Complaint  Patient presents with  . Follow-up    HPI: Kristin Harper 40 y.o. comes in for follow-up of abscess cellulitis of her left thigh. Since last time it is much better but it is still draining. She only took one dose of doxycycline and stopped because of GI symptoms but also we agreed with not taking it because of her history of pseudotumor. She just began the Keflex yesterday. Is back to work. Urine is sometimes dark and a funny color. No dysuria. ROS: See pertinent positives and negatives per HPI.  Past Medical History  Diagnosis Date  . Hx of varicella   . Abnormal Pap smear     Over 10 years ago per pt, pre cancerous cells were removed.   . Acid reflux   . Fibroid     Family History  Problem Relation Age of Onset  . Hypertension Mother   . Other Father     shot and killed age 4  . Heart attack Maternal Aunt     Social History   Social History  . Marital Status: Single    Spouse Name: N/A  . Number of Children: N/A  . Years of Education: N/A   Social History Main Topics  . Smoking status: Never Smoker   . Smokeless tobacco: Never Used  . Alcohol Use: Yes     Comment: socially  . Drug Use: No  . Sexual Activity:    Partners: Male    Birth Control/ Protection: Condom   Other Topics Concern  . 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 acetaZOLAMIDE (DIAMOX) 500 MG capsule Take 500 mg by mouth daily.   3  . cephALEXin (KEFLEX) 500 MG capsule Take 1 capsule (500 mg total) by mouth 3 (three) times daily. 21 capsule 0  . diazepam (VALIUM) 10 MG tablet Place 1 tab into the vagina every 12 hours as needed for pelvic floor spasm.     No  facility-administered medications prior to visit.     EXAM:  BP 120/70 mmHg  Temp(Src) 98.2 F (36.8 C) (Oral)  Wt 283 lb 1.6 oz (128.413 kg)  LMP 07/27/2015  Body mass index is 45.72 kg/(m^2).  GENERAL: vitals reviewed and listed above, alert, oriented, appears well hydrated and in no acute distress HEENT: atraumatic, conjunctiva  clear, no obvious abnormalities on inspection of external nose and ears OP : no lesion edema or exudate  Area of concern has much less redness and swelling induration is greatly reduced there is a 1 cm area of tissue openings with some white discharge yellow wished hand on expression. Much less tenderness than previous exam. Covered with dry dressing paper tape. ASSESSMENT AND PLAN:  Discussed the following assessment and plan:  Cellulitis and abscess of leg - Much improved but still is draining about 70% better clinically hard to tell how deep the induration goes however. Her pain is much better antibiotic follow-up  Se phone notes  Fu if not continueing improvmemtn Curiously the culture had no growth. -Patient advised to return or notify health care team  if symptoms worsen ,persist or new concerns arise.  Patient Instructions  Looks  about 70 % better   Still draining .  Stay on antibiotic  If not a lot better to continue  And draining next week at end of antibiotic  Then recheck here.  If urine concern of fever  We can recheck that      [place]. Jashley Yellin M.D.

## 2015-12-29 ENCOUNTER — Encounter: Payer: Self-pay | Admitting: Internal Medicine

## 2015-12-29 ENCOUNTER — Ambulatory Visit: Payer: BC Managed Care – PPO | Admitting: Internal Medicine

## 2015-12-29 ENCOUNTER — Ambulatory Visit (INDEPENDENT_AMBULATORY_CARE_PROVIDER_SITE_OTHER): Payer: BC Managed Care – PPO | Admitting: Internal Medicine

## 2015-12-29 VITALS — BP 124/80 | HR 89 | Temp 98.2°F | Ht 66.0 in | Wt 288.4 lb

## 2015-12-29 DIAGNOSIS — J40 Bronchitis, not specified as acute or chronic: Secondary | ICD-10-CM

## 2015-12-29 DIAGNOSIS — J22 Unspecified acute lower respiratory infection: Secondary | ICD-10-CM

## 2015-12-29 MED ORDER — ALBUTEROL SULFATE (2.5 MG/3ML) 0.083% IN NEBU
2.5000 mg | INHALATION_SOLUTION | Freq: Once | RESPIRATORY_TRACT | Status: AC
  Administered 2015-12-29: 2.5 mg via RESPIRATORY_TRACT

## 2015-12-29 MED ORDER — AZITHROMYCIN 250 MG PO TABS: ORAL_TABLET | ORAL | 0 refills | Status: DC

## 2015-12-29 NOTE — Progress Notes (Signed)
Pre visit review using our clinic review tool, if applicable. No additional management support is needed unless otherwise documented below in the visit note. 

## 2015-12-29 NOTE — Patient Instructions (Addendum)
I dont hear pneumonia  But you may have a wheezy bronchitis  . Most  Of these resolve on their own with  Time and mucinex and fluids . If getting worse over weekend can add  Antibiotic but usually doesn help  Bronchitis .    Cough med and lfuids     Acute Bronchitis Bronchitis is inflammation of the airways that extend from the windpipe into the lungs (bronchi). The inflammation often causes mucus to develop. This leads to a cough, which is the most common symptom of bronchitis.  In acute bronchitis, the condition usually develops suddenly and goes away over time, usually in a couple weeks. Smoking, allergies, and asthma can make bronchitis worse. Repeated episodes of bronchitis may cause further lung problems.  CAUSES Acute bronchitis is most often caused by the same virus that causes a cold. The virus can spread from person to person (contagious) through coughing, sneezing, and touching contaminated objects. SIGNS AND SYMPTOMS   Cough.   Fever.   Coughing up mucus.   Body aches.   Chest congestion.   Chills.   Shortness of breath.   Sore throat.  DIAGNOSIS  Acute bronchitis is usually diagnosed through a physical exam. Your health care provider will also ask you questions about your medical history. Tests, such as chest X-rays, are sometimes done to rule out other conditions.  TREATMENT  Acute bronchitis usually goes away in a couple weeks. Oftentimes, no medical treatment is necessary. Medicines are sometimes given for relief of fever or cough. Antibiotic medicines are usually not needed but may be prescribed in certain situations. In some cases, an inhaler may be recommended to help reduce shortness of breath and control the cough. A cool mist vaporizer may also be used to help thin bronchial secretions and make it easier to clear the chest.  HOME CARE INSTRUCTIONS  Get plenty of rest.   Drink enough fluids to keep your urine clear or pale yellow (unless you have a  medical condition that requires fluid restriction). Increasing fluids may help thin your respiratory secretions (sputum) and reduce chest congestion, and it will prevent dehydration.   Take medicines only as directed by your health care provider.  If you were prescribed an antibiotic medicine, finish it all even if you start to feel better.  Avoid smoking and secondhand smoke. Exposure to cigarette smoke or irritating chemicals will make bronchitis worse. If you are a smoker, consider using nicotine gum or skin patches to help control withdrawal symptoms. Quitting smoking will help your lungs heal faster.   Reduce the chances of another bout of acute bronchitis by washing your hands frequently, avoiding people with cold symptoms, and trying not to touch your hands to your mouth, nose, or eyes.   Keep all follow-up visits as directed by your health care provider.  SEEK MEDICAL CARE IF: Your symptoms do not improve after 1 week of treatment.  SEEK IMMEDIATE MEDICAL CARE IF:  You develop an increased fever or chills.   You have chest pain.   You have severe shortness of breath.  You have bloody sputum.   You develop dehydration.  You faint or repeatedly feel like you are going to pass out.  You develop repeated vomiting.  You develop a severe headache. MAKE SURE YOU:   Understand these instructions.  Will watch your condition.  Will get help right away if you are not doing well or get worse.   This information is not intended to replace advice given  to you by your health care provider. Make sure you discuss any questions you have with your health care provider.   Document Released: 03/14/2004 Document Revised: 02/25/2014 Document Reviewed: 07/28/2012 Elsevier Interactive Patient Education [employer].

## 2015-12-29 NOTE — Progress Notes (Deleted)
   No chief complaint on file.   HPI: Kristin Harper 40 y.o.  ROS: See pertinent positives and negatives per HPI.  Past Medical History:  Diagnosis Date  . Abnormal Pap smear    Over 10 years ago per pt, pre cancerous cells were removed.   . Acid reflux   . Fibroid   . Hx of varicella     Family History  Problem Relation Age of Onset  . Hypertension Mother   . Other Father     shot and killed age 67  . Heart attack Maternal Aunt     Social History   Social History  . Marital status: Single    Spouse name: N/A  . Number of children: N/A  . Years of education: N/A   Social History Main Topics  . Smoking status: Never Smoker  . Smokeless tobacco: Never Used  . Alcohol use Yes     Comment: socially  . Drug use: No  . Sexual activity: Yes    Partners: Male    Birth control/ protection: Condom   Other Topics Concern  . Not on file   Social History Narrative   g 1 p 0    No tobacco    internet based business   Works at [employer]    Outpatient Medications Prior to Visit  Medication Sig Dispense Refill  . acetaZOLAMIDE (DIAMOX) 500 MG capsule Take 500 mg by mouth daily.   3  . cephALEXin (KEFLEX) 500 MG capsule Take 1 capsule (500 mg total) by mouth 3 (three) times daily. 21 capsule 0  . diazepam (VALIUM) 10 MG tablet Place 1 tab into the vagina every 12 hours as needed for pelvic floor spasm.     No facility-administered medications prior to visit.      EXAM:  There were no vitals taken for this visit.  There is no height or weight on file to calculate BMI.  GENERAL: vitals reviewed and listed above, alert, oriented, appears well hydrated and in no acute distress HEENT: atraumatic, conjunctiva  clear, no obvious abnormalities on inspection of external nose and ears OP : no lesion edema or exudate  NECK: no obvious masses on inspection palpation  LUNGS: clear to auscultation bilaterally, no wheezes, rales or rhonchi, good air movement CV: HRRR, no clubbing  cyanosis or  peripheral edema nl cap refill  MS: moves all extremities without noticeable focal  abnormality PSYCH: pleasant and cooperative, no obvious depression or anxiety  ASSESSMENT AND PLAN:  Discussed the following assessment and plan:  No diagnosis found.  -Patient advised to return or notify health care team  if symptoms worsen ,persist or new concerns arise.  There are no Patient Instructions on file for this visit.   Standley Brooking. Mathan Darroch M.D.

## 2015-12-29 NOTE — Progress Notes (Signed)
Chief Complaint  Patient presents with  . Cough    HPI: Kristin Harper 40 y.o.   Onset about 10 days ago  And otc meds mucinex  And  3 days ago felt some better  And then cough cpontinues  And now getting worse  Yellow pm now  And  Bad at night    Chest hurts  lkeeps I[ up and ocnstant throat clearing and draining.   Tight cough no hx asthma or wheezing per se    mucinex seems better than other meds   ROS: See pertinent positives and negatives per HPI. No fever  Sob now  . But som doe   Past Medical History:  Diagnosis Date  . Abnormal Pap smear    Over 10 years ago per pt, pre cancerous cells were removed.   . Acid reflux   . Fibroid   . Hx of varicella     Family History  Problem Relation Age of Onset  . Hypertension Mother   . Other Father     shot and killed age 87  . Heart attack Maternal Aunt     Social History   Social History  . Marital status: Single    Spouse name: N/A  . Number of children: N/A  . Years of education: N/A   Social History Main Topics  . Smoking status: Never Smoker  . Smokeless tobacco: Never Used  . Alcohol use Yes     Comment: socially  . Drug use: No  . Sexual activity: Yes    Partners: Male    Birth control/ protection: Condom   Other Topics Concern  . None   Social History Narrative   g 1 p 0    No tobacco    internet based business   Works at [employer]    Outpatient Medications Prior to Visit  Medication Sig Dispense Refill  . acetaZOLAMIDE (DIAMOX) 500 MG capsule Take 500 mg by mouth daily.   3  . diazepam (VALIUM) 10 MG tablet Place 1 tab into the vagina every 12 hours as needed for pelvic floor spasm.    . cephALEXin (KEFLEX) 500 MG capsule Take 1 capsule (500 mg total) by mouth 3 (three) times daily. 21 capsule 0   No facility-administered medications prior to visit.      EXAM:  BP 124/80   Pulse 89   Temp 98.2 F (36.8 C) (Oral)   Ht 5\' 6"  (1.676 m)   Wt 288 lb 6 oz (130.8 kg)   SpO2 95%   BMI 46.54 kg/m    Body mass index is 46.54 kg/m. WDWN in NAD  quiet respirations; mildly congested  somewhat hoarse. Non toxic . Wheezy cough  HEENT: Normocephalic ;atraumatic , Eyes;  PERRL, EOMs  Full, lids and conjunctiva clear,,Ears: no deformities, canals nl, TM landmarks normal, Nose: no deformity or discharge but congested;face minimally tender Mouth : OP clear without lesion or edema . Neck: Supple without adenopathy or masses or bruits Chest:    No rales  Musical  bs  ?bs no rhonchi   After albuterol neb some inc  bs but no rales  And still musical wheezes  CV:  S1-S2 no gallops or murmurs peripheral perfusion is normal Skin :nl perfusion and no acute rashes  ASSESSMENT AND PLAN:  Discussed the following assessment and plan:  Acute respiratory infection  Wheezy bronchitis - Plan: albuterol (PROVENTIL) (2.5 MG/3ML) 0.083% nebulizer solution 2.5 mg Appears to be primarily viral but is prolonged  and relapsing. At the 10-12 days marked discussed risk-benefit of antibiotics with only help for early pneumonia versus viral bronchitis. Is a weekend patient is concerned prescription printed for her. Mild improvement with nebulizer she's never used inhalers before. Discussed cough medicines for comfort expectant management and follow-up next week if not improving. -Patient advised to return or notify health care team  if symptoms worsen ,persist or new concerns arise.  Patient Instructions  I dont hear pneumonia  But you may have a wheezy bronchitis  . Most  Of these resolve on their own with  Time and mucinex and fluids . If getting worse over weekend can add  Antibiotic but usually doesn help  Bronchitis .    Cough med and lfuids     Acute Bronchitis Bronchitis is inflammation of the airways that extend from the windpipe into the lungs (bronchi). The inflammation often causes mucus to develop. This leads to a cough, which is the most common symptom of bronchitis.  In acute bronchitis, the condition  usually develops suddenly and goes away over time, usually in a couple weeks. Smoking, allergies, and asthma can make bronchitis worse. Repeated episodes of bronchitis may cause further lung problems.  CAUSES Acute bronchitis is most often caused by the same virus that causes a cold. The virus can spread from person to person (contagious) through coughing, sneezing, and touching contaminated objects. SIGNS AND SYMPTOMS   Cough.   Fever.   Coughing up mucus.   Body aches.   Chest congestion.   Chills.   Shortness of breath.   Sore throat.  DIAGNOSIS  Acute bronchitis is usually diagnosed through a physical exam. Your health care provider will also ask you questions about your medical history. Tests, such as chest X-rays, are sometimes done to rule out other conditions.  TREATMENT  Acute bronchitis usually goes away in a couple weeks. Oftentimes, no medical treatment is necessary. Medicines are sometimes given for relief of fever or cough. Antibiotic medicines are usually not needed but may be prescribed in certain situations. In some cases, an inhaler may be recommended to help reduce shortness of breath and control the cough. A cool mist vaporizer may also be used to help thin bronchial secretions and make it easier to clear the chest.  HOME CARE INSTRUCTIONS  Get plenty of rest.   Drink enough fluids to keep your urine clear or pale yellow (unless you have a medical condition that requires fluid restriction). Increasing fluids may help thin your respiratory secretions (sputum) and reduce chest congestion, and it will prevent dehydration.   Take medicines only as directed by your health care provider.  If you were prescribed an antibiotic medicine, finish it all even if you start to feel better.  Avoid smoking and secondhand smoke. Exposure to cigarette smoke or irritating chemicals will make bronchitis worse. If you are a smoker, consider using nicotine gum or skin  patches to help control withdrawal symptoms. Quitting smoking will help your lungs heal faster.   Reduce the chances of another bout of acute bronchitis by washing your hands frequently, avoiding people with cold symptoms, and trying not to touch your hands to your mouth, nose, or eyes.   Keep all follow-up visits as directed by your health care provider.  SEEK MEDICAL CARE IF: Your symptoms do not improve after 1 week of treatment.  SEEK IMMEDIATE MEDICAL CARE IF:  You develop an increased fever or chills.   You have chest pain.   You have severe  shortness of breath.  You have bloody sputum.   You develop dehydration.  You faint or repeatedly feel like you are going to pass out.  You develop repeated vomiting.  You develop a severe headache. MAKE SURE YOU:   Understand these instructions.  Will watch your condition.  Will get help right away if you are not doing well or get worse.   This information is not intended to replace advice given to you by your health care provider. Make sure you discuss any questions you have with your health care provider.   Document Released: 03/14/2004 Document Revised: 02/25/2014 Document Reviewed: 07/28/2012 Elsevier Interactive Patient Education 2016 [place] K. Panosh M.D.

## 2016-01-10 ENCOUNTER — Ambulatory Visit (INDEPENDENT_AMBULATORY_CARE_PROVIDER_SITE_OTHER): Payer: BC Managed Care – PPO | Admitting: Family Medicine

## 2016-01-10 VITALS — BP 120/90 | HR 88 | Temp 98.2°F | Ht 66.0 in | Wt 287.0 lb

## 2016-01-10 DIAGNOSIS — J0120 Acute ethmoidal sinusitis, unspecified: Secondary | ICD-10-CM

## 2016-01-10 MED ORDER — DOXYCYCLINE HYCLATE 100 MG PO CAPS: 100.0000 mg | ORAL_CAPSULE | Freq: Two times a day (BID) | ORAL | 0 refills | Status: DC

## 2016-01-10 NOTE — Progress Notes (Signed)
Pre visit review using our clinic review tool, if applicable. No additional management support is needed unless otherwise documented below in the visit note. 

## 2016-01-10 NOTE — Patient Instructions (Signed)

## 2016-01-10 NOTE — Progress Notes (Signed)
Subjective:     Patient ID: Kristin Harper, female   DOB: 1976-02-12, 40 y.o.   MRN: [id number]  HPI Patient seen with persistent sinusitis symptoms. She was seen early November and initially felt she had viral URI. She was seen here on November 10 and treated with Zithromax. She felt slightly better for a few days but now has increasing bilateral ethmoid sinus pressure, yellow nasal mucus, trace blood and intermittent headaches and malaise. No fever. No chills. No nausea or vomiting. She takes Diamox for normal pressure hydrocephalus  Past Medical History:  Diagnosis Date  . Abnormal Pap smear    Over 10 years ago per pt, pre cancerous cells were removed.   . Acid reflux   . Fibroid   . Hx of varicella    Past Surgical History:  Procedure Laterality Date  . DILATION AND CURETTAGE OF UTERUS  2005   In [place]  . NASAL SEPTUM SURGERY  2004  . TONSILLECTOMY    . [place] RESECTION  2004    reports that she has never smoked. She has never used smokeless tobacco. She reports that she drinks alcohol. She reports that she does not use drugs. family history includes Heart attack in her maternal aunt; Hypertension in her mother; Other in her father. Allergies  Allergen Reactions  . Penicillins Hives and Rash     Review of Systems  Constitutional: Positive for fatigue. Negative for chills and fever.  HENT: Positive for congestion and sinus pain.   Respiratory: Positive for cough.   Neurological: Positive for headaches.       Objective:   Physical Exam  Constitutional: She appears well-developed and well-nourished.  HENT:  Right Ear: External ear normal.  Left Ear: External ear normal.  Mouth/Throat: Oropharynx is clear and moist. No oropharyngeal exudate.  Mild nasal mucosa erythema. Increased turbinates swelling bilaterally.  Neck: Neck supple.  Cardiovascular: Normal rate and regular rhythm.   Pulmonary/Chest: Effort normal and breath sounds normal. No respiratory distress. She  has no wheezes. She has no rales.  Lymphadenopathy:    She has no cervical adenopathy.       Assessment:     Probable acute ethmoid sinusitis    Plan:     -Stay well-hydrated -Doxycycline 100 mg twice daily for 10 days -Follow-up with primary in 2 weeks if not significantly improved and sooner for any fever, worsening headache, or other concerns  Eulas Post MD [facility]

## 2016-01-15 ENCOUNTER — Telehealth: Payer: Self-pay | Admitting: Internal Medicine

## 2016-01-15 MED ORDER — LEVOFLOXACIN 500 MG PO TABS: 500.0000 mg | ORAL_TABLET | Freq: Every day | ORAL | 0 refills | Status: DC

## 2016-01-15 NOTE — Telephone Encounter (Signed)
° ° ° °  Pt said she read the side effects on the following med   doxycycline (VIBRAMYCIN) 100 MG capsule  Pt said it is know to cause intracranial hypo tention and she already has this and is asking if there is something else she can take   [place]

## 2016-01-15 NOTE — Telephone Encounter (Signed)
We are avoiding PCN and cephalosporins with her drug allergy hx.  She recently took Z-max without improvement. Other option would be Levaquin 500 mg po qd for 10 days.

## 2016-01-15 NOTE — Telephone Encounter (Signed)
Pt is aware via voicemail that medication has been sent into pharmacy.

## 2016-01-15 NOTE — Telephone Encounter (Signed)
Pt was seen on 01/10/2016. Please review.

## 2016-04-09 ENCOUNTER — Encounter (HOSPITAL_COMMUNITY): Payer: Self-pay | Admitting: Emergency Medicine

## 2016-04-09 ENCOUNTER — Ambulatory Visit (HOSPITAL_COMMUNITY)
Admission: EM | Admit: 2016-04-09 | Discharge: 2016-04-09 | Disposition: A | Discharge status: Home / Self Care | Payer: BLUE CROSS/BLUE SHIELD | Attending: Family Medicine | Admitting: Family Medicine

## 2016-04-09 DIAGNOSIS — L508 Other urticaria: Secondary | ICD-10-CM | POA: Diagnosis not present

## 2016-04-09 MED ORDER — HYDROXYZINE HCL 25 MG PO TABS: 25.0000 mg | ORAL_TABLET | Freq: Four times a day (QID) | ORAL | 2 refills | Status: DC

## 2016-04-09 NOTE — ED Provider Notes (Signed)
[place]    CSN: [id number] Arrival date & time: 04/09/16  1206     History   Chief Complaint Chief Complaint  Patient presents with  . Medication Reaction    HPI Kristin Harper is a 41 y.o. female.    Rash  Location:  Full body Quality: redness and swelling   Severity:  Mild Onset quality:  Sudden Duration:  4 days Progression:  Unchanged Chronicity:  New Relieved by:  Antihistamines Worsened by:  Nothing Ineffective treatments:  None tried Associated symptoms: throat swelling   Associated symptoms: no abdominal pain, no shortness of breath and no tongue swelling     Past Medical History:  Diagnosis Date  . Abnormal Pap smear    Over 10 years ago per pt, pre cancerous cells were removed.   . Acid reflux   . Fibroid   . Hx of varicella     Patient Active Problem List   Diagnosis Date Noted  . Urinary frequency 01/22/2012  . History of recurrent UTIs 01/09/2012  . IRREGULAR MENSES 10/07/2008  . DELAYED MENSES 11/13/2007  . POLYCYSTIC OVARIAN DISEASE 09/11/2007  . ELEVATED BLOOD PRESSURE 09/11/2007    Past Surgical History:  Procedure Laterality Date  . DILATION AND CURETTAGE OF UTERUS  2005   In [place]  . NASAL SEPTUM SURGERY  2004  . TONSILLECTOMY    . TURBINATE RESECTION  2004    OB History    Gravida Para Term Preterm AB Living   1       1 0   SAB TAB Ectopic Multiple Live Births   1               Home Medications    Prior to Admission medications   Medication Sig Start Date End Date Taking? Authorizing Provider  acetaZOLAMIDE (DIAMOX) 500 MG capsule Take 500 mg by mouth daily.  12/17/13  Yes Historical Provider, MD  hydrOXYzine (ATARAX/VISTARIL) 25 MG tablet Take 1 tablet (25 mg total) by mouth every 6 (six) hours. Prn urticaria 04/09/16   Billy Fischer, MD    Family History Family History  Problem Relation Age of Onset  . Hypertension Mother   . Other Father     shot and killed age 19  . Heart attack Maternal Aunt      Social History Social History  Substance Use Topics  . Smoking status: Never Smoker  . Smokeless tobacco: Never Used  . Alcohol use Yes     Comment: socially     Allergies   Penicillins   Review of Systems Review of Systems  Constitutional: Negative.   HENT: Negative.   Respiratory: Negative.  Negative for shortness of breath.   Cardiovascular: Negative.   Gastrointestinal: Negative.  Negative for abdominal pain.  Skin: Positive for rash.  Psychiatric/Behavioral: The patient is nervous/anxious.   All other systems reviewed and are negative.    Physical Exam Triage Vital Signs ED Triage Vitals  Enc Vitals Group     BP 04/09/16 1242 133/86     Pulse Rate 04/09/16 1242 86     Resp 04/09/16 1242 18     Temp 04/09/16 1242 98.6 F (37 C)     Temp Source 04/09/16 1242 Oral     SpO2 04/09/16 1242 100 %     Weight --      Height --      Head Circumference --      Peak Flow --      Pain  Score 04/09/16 1241 0     Pain Loc --      Pain Edu? --      Excl. in [place]? --    No data found.   Updated Vital Signs BP 133/86 (BP Location: Right Arm)   Pulse 86   Temp 98.6 F (37 C) (Oral)   Resp 18   SpO2 100%   Visual Acuity Right Eye Distance:   Left Eye Distance:   Bilateral Distance:    Right Eye Near:   Left Eye Near:    Bilateral Near:     Physical Exam  Constitutional: She appears well-developed and well-nourished. No distress.  HENT:  Right Ear: External ear normal.  Left Ear: External ear normal.  Nose: Nose normal.  Mouth/Throat: Oropharynx is clear and moist.  Neck: Normal range of motion. Neck supple.  Cardiovascular: Normal rate, regular rhythm, normal heart sounds and intact distal pulses.   Pulmonary/Chest: Effort normal and breath sounds normal.  Abdominal: Soft. Bowel sounds are normal.  Lymphadenopathy:    She has no cervical adenopathy.  Skin: Skin is warm and dry. Rash noted. There is erythema.  Hives, urticaria  Nursing note and  vitals reviewed.    UC Treatments / Results  Labs (all labs ordered are listed, but only abnormal results are displayed) Labs Reviewed - No data to display  EKG  EKG Interpretation None       Radiology No results found.  Procedures Procedures (including critical care time)  Medications Ordered in UC Medications - No data to display   Initial Impression / Assessment and Plan / UC Course  I have reviewed the triage vital signs and the nursing notes.  Pertinent labs & imaging results that were available during my care of the patient were reviewed by me and considered in my medical decision making (see chart for details).       Final Clinical Impressions(s) / UC Diagnoses   Final diagnoses:  Urticaria multiforme    New Prescriptions New Prescriptions   HYDROXYZINE (ATARAX/VISTARIL) 25 MG TABLET    Take 1 tablet (25 mg total) by mouth every 6 (six) hours. Prn urticaria     Billy Fischer, MD 04/09/16 [phone]

## 2016-04-09 NOTE — ED Triage Notes (Addendum)
The patient presented to the [facility] with a complaint of hives that have been appearing x 3 days and she reported some wheezing and chest tightness that started earlier this am . The patient also reported that she started taking Diamox 4 days ago.

## 2017-04-21 NOTE — Progress Notes (Signed)
Chief Complaint  Patient presents with  . Cyst    boil/cyst under breast x 1 month ago. Since that spot she has noticed different spots. Pt has noticed now a swollen lymph node behind her left ear and now ears feel plugged. Pt is not sure if all of her issues are coming from recurrent boils or she she has some kind of infection that her body is trying to fight off. Seems like a chain reaction of issues.    HPI: Kristin Harper 42 y.o.    problom basaed visit  Last seen 2017  See above .  Has a number of concerns.  Began with skin issues and now a swollen gland on the left neck area.  Without fever or no new tenderness.  She has a number of concerns.   Rash under her breast is better now.  Gets itching in other areas.  Some itching under breast . At times  No tbetter . At tims  No rashj but ithcing would make redness  Over a months of problems but  Left ear ln  Was swollen and got concerned .  She does use Q-tips sometimes to clean her ears out and is itchy gets scaly areas around her ear canals but no discharge or pain and no fever.  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   Think she might be allergic to vitamin C because when she had an illness she took a number of it and then began itching but no specific rash.  Since that time when she has had a citrus smoothing she thinks she gets itching but no hives.  She tried apple juice and she thought it happened the same way  She has not really tried fresh fruit single.  There are no mouth symptoms. ROS: See pertinent positives and negatives per HPI.  No   cv opulm sx   Past Medical History:  Diagnosis Date  . Abnormal Pap smear    Over 10 years ago per pt, pre cancerous cells were removed.   . Acid reflux   . Fibroid   . Hx of varicella     Family History  Problem Relation Age of Onset  . Hypertension Mother   . Other Father        shot and killed age 41  . Heart attack Maternal Aunt      Social History   Socioeconomic History  . Marital status: Single    Spouse name: None  . Number of children: None  . Years of education: None  . Highest education level: None  Social Needs  . Financial resource strain: None  . Food insecurity - worry: None  . Food insecurity - inability: None  . Transportation needs - medical: None  . Transportation needs - non-medical: None  Occupational History  . None  Tobacco Use  . Smoking status: Never Smoker  . Smokeless tobacco: Never Used  Substance and Sexual Activity  . Alcohol use: Yes    Comment: socially  . Drug use: No  . Sexual activity: Yes    Partners: Male    Birth control/protection: Condom  Other Topics Concern  . None  Social History Narrative   g 1 p 0    No tobacco    internet based business   Works at [employer]    Outpatient Medications Prior to Visit  Medication Sig Dispense Refill  . acetaZOLAMIDE (DIAMOX) 500 MG capsule  Take 500 mg by mouth daily.   3  . hydrOXYzine (ATARAX/VISTARIL) 25 MG tablet Take 1 tablet (25 mg total) by mouth every 6 (six) hours. Prn urticaria (Patient not taking: Reported on 04/22/2017) 30 tablet 2   No facility-administered medications prior to visit.      EXAM:  BP 122/80 (BP Location: Right Wrist, Patient Position: Sitting, Cuff Size: Normal)   Pulse 87   Temp 98.2 F (36.8 C) (Oral)   Wt 290 lb 8 oz (131.8 kg)   BMI 46.89 kg/m   Body mass index is 46.89 kg/m.  GENERAL: vitals reviewed and listed above, alert, oriented, appears well hydrated and in no acute distress HEENT: atraumatic, conjunctiva  clear, no obvious abnormalities on inspection of external nose and ears OP : no lesion edema or exudate external ears has some mild scaling at the opening of canal and minimal retroauricular.  No discharge and no redness TMs are intact and appear to be normal and translucent. OP no obvious dental issues or lesions. NECK: There is a 1.5 cm mobile fairly firm ACL and on the  left.  Otherwise no acute adenopathy elsewhere. Abdomen soft without organomegaly guarding or rebound. Skin no acute findings except as above some scaliness near the ears few papules here and there but no abscesses or plaque.  There is some hyperpigmentation around the waistline anterior chest and neck. CV: HRRR, no clubbing cyanosis or  peripheral edema nl cap refill  MS: moves all extremities without noticeable focal  abnormality PSYCH: pleasant and cooperative, no obvious depression or anxiety  ASSESSMENT AND PLAN:  Discussed the following assessment and plan:  Reactive cervical lymphadenopathy  Eczema, unspecified type  vs seborrhea   BMI 45.0-49.9, adult (HCC)  Itching - w vit d supplement She has a number of concerns today with the most prominent is the left swollen gland which appears to be a reactive cervical lymphadenopathy possibly related to scaliness rear the ear and associated areas.  Empiric treatment with cephalexin of note she only had a rash with penicillin.  For itchy ears and scaling she can use over-the-counter hydrocortisone a couple times a day for 2 weeks to see if it calms it down. Reassured her that I do not think she is getting skin infections from going to places like [facility] and she wears gloves at that time also I reassured her that she has normal good hygiene and it is not related to that. Sometimes weight loss is helpful for skin conditions. In regard to her itching at times that she thinks is from the vitamin C and citrus would have her try 1 fruit as a time avoid supplements have a natural fruit to see if she has symptoms.  Encouraged food over supplements anyway.  Total visit 14mins > 50% spent counseling and coordinating care as indicated in above note and in instructions to patient .      Expectant management. And fu advised can also try a food challenge try a single fresh fruit such as in apple or something citrus and it but no juices or supplements  and see if she gets the same reaction. Suggest she get blood work yearly for metabolic testing. She will get via gyne per patient  Dr Leo Grosser  Should follow-up if he is not having improvement in all of these areas. -Patient advised to return or notify health care team  if symptoms worsen ,persist or new concerns arise.  Patient Instructions  Ear exam is good .  If    Not getting better  Then  Plan fu hearing.   At this time  Antibiotic   For swollen   gland possible reacting to skin infection .   Suggest  Yearly  blood sugar  Cholesterol evaluation .    If the Ln not responding   Then get back for fu exam  Evaluation.          Standley Brooking. Clela Hagadorn M.D.

## 2017-04-22 ENCOUNTER — Encounter: Payer: Self-pay | Admitting: Internal Medicine

## 2017-04-22 ENCOUNTER — Ambulatory Visit: Payer: BLUE CROSS/BLUE SHIELD | Admitting: Internal Medicine

## 2017-04-22 VITALS — BP 122/80 | HR 87 | Temp 98.2°F | Wt 290.5 lb

## 2017-04-22 DIAGNOSIS — L299 Pruritus, unspecified: Secondary | ICD-10-CM

## 2017-04-22 DIAGNOSIS — R590 Localized enlarged lymph nodes: Secondary | ICD-10-CM

## 2017-04-22 DIAGNOSIS — L309 Dermatitis, unspecified: Secondary | ICD-10-CM | POA: Diagnosis not present

## 2017-04-22 DIAGNOSIS — Z6841 Body Mass Index (BMI) 40.0 and over, adult: Secondary | ICD-10-CM | POA: Diagnosis not present

## 2017-04-22 MED ORDER — CEPHALEXIN 500 MG PO CAPS: 500.0000 mg | ORAL_CAPSULE | Freq: Three times a day (TID) | ORAL | 0 refills | Status: AC

## 2017-04-22 NOTE — Patient Instructions (Addendum)
Ear exam is good .  If    Not getting better  Then  Plan fu hearing.   At this time  Antibiotic   For swollen   gland possible reacting to skin infection .   Suggest  Yearly  blood sugar  Cholesterol evaluation .    If the Ln not responding   Then get back for fu exam  Evaluation.

## 2017-08-05 DIAGNOSIS — N959 Unspecified menopausal and perimenopausal disorder: Secondary | ICD-10-CM

## 2017-08-05 HISTORY — DX: Unspecified menopausal and perimenopausal disorder: N95.9

## 2017-09-24 IMAGING — CT CT ABD-PELV W/O CM
2 of 4 series · 14 of 36 positions shown, 19 images · non-contrast
Comparison: None.

CLINICAL DATA: Left flank pain 1 week with nausea. Urinary
frequency.

EXAM:
CT ABDOMEN AND PELVIS WITHOUT CONTRAST
TECHNIQUE: Multidetector CT imaging of the abdomen and pelvis was performed
following the standard protocol without IV contrast.

[Series 601: coronal body · coronal · 0.93mm/px · 1 of 140 slices shown, 2 images]
[im 47/140  soft-tissue]
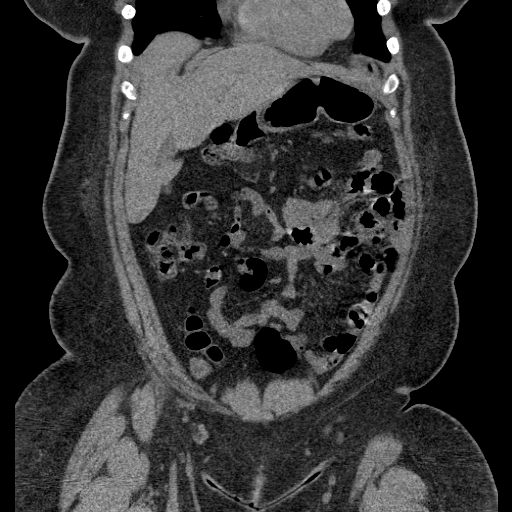
[im 47/140  bone]
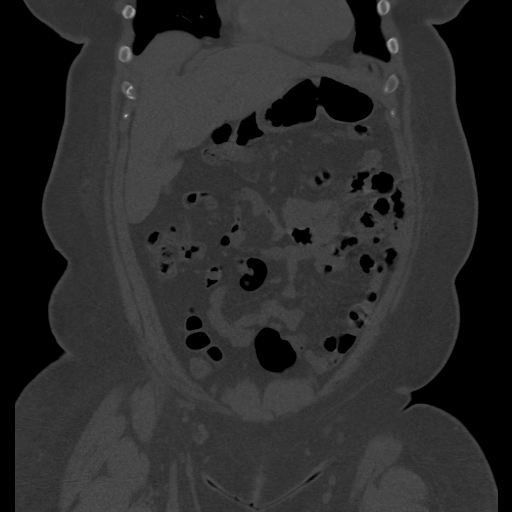

[Series 602: sagittal body · sagittal · 0.93mm/px · 13 of 187 slices shown, 17 images]
[im 10/187  soft-tissue]
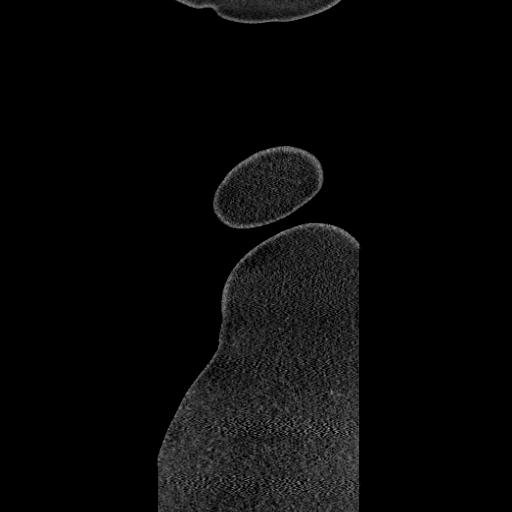
[im 10/187  lung]
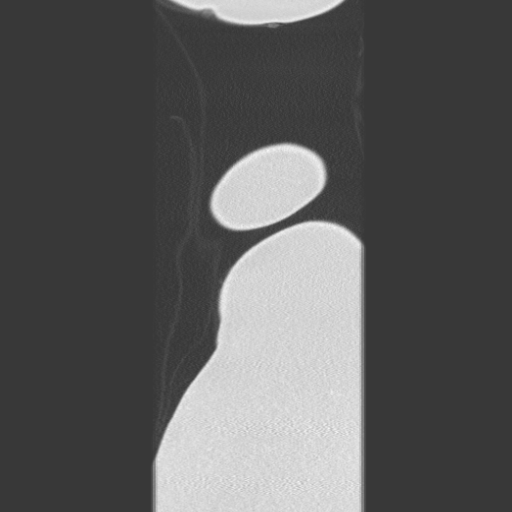
[im 10/187  bone]
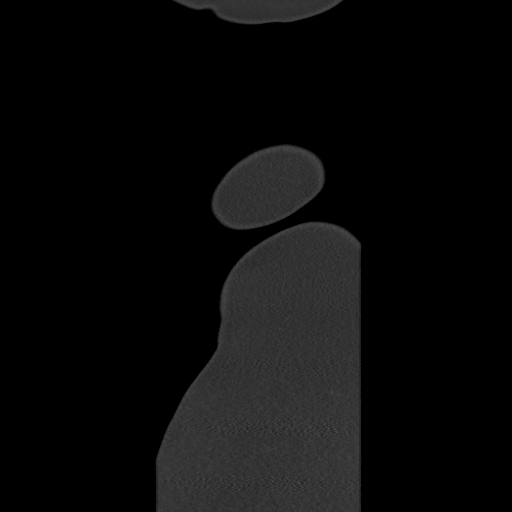
[im 20/187  lung]
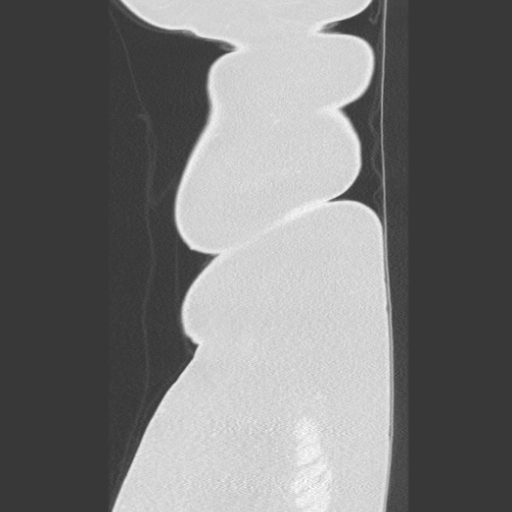
[im 30/187  soft-tissue]
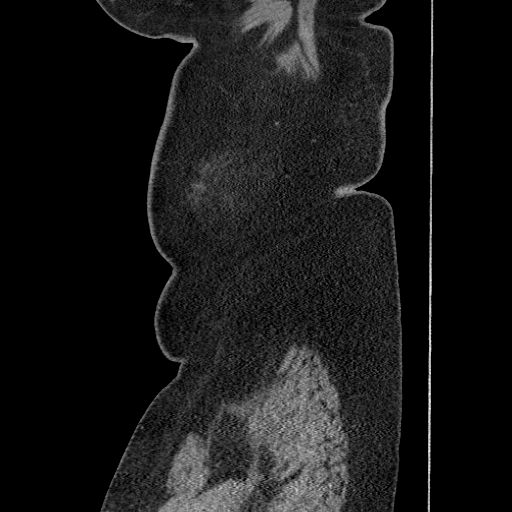
[im 30/187  lung]
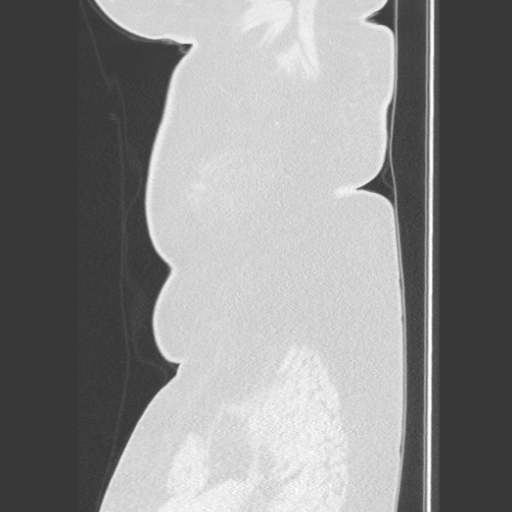
[im 40/187  lung]
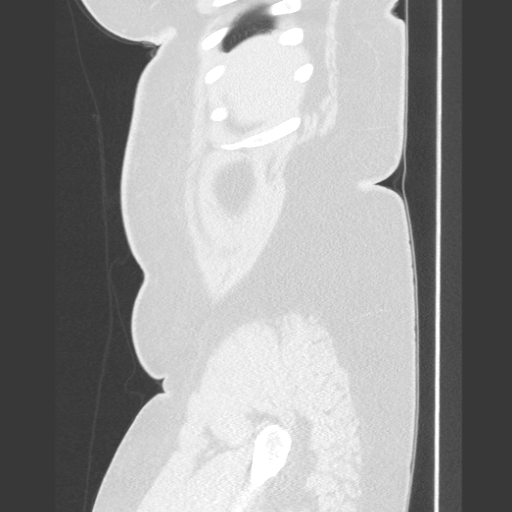
[im 49/187  soft-tissue]
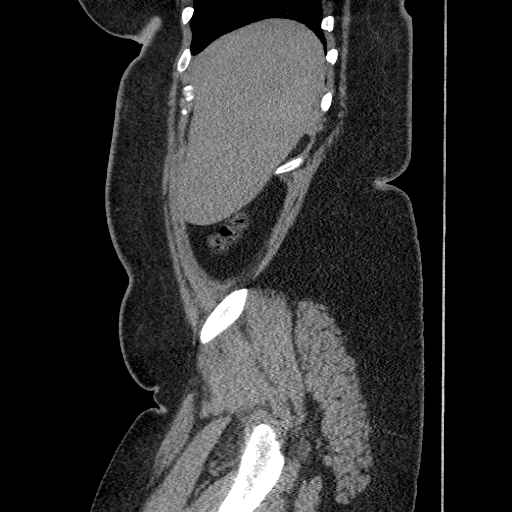
[im 59/187  soft-tissue]
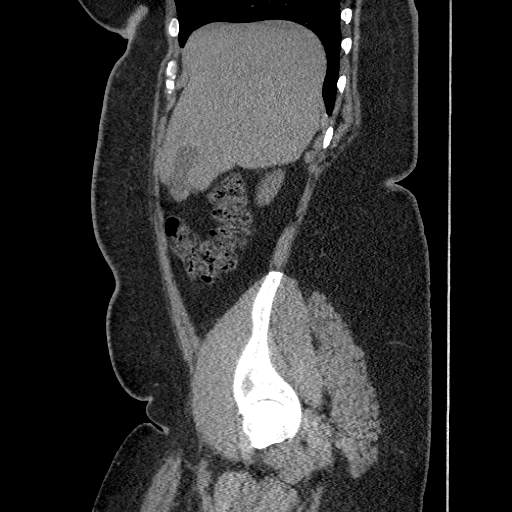
[im 79/187  soft-tissue]
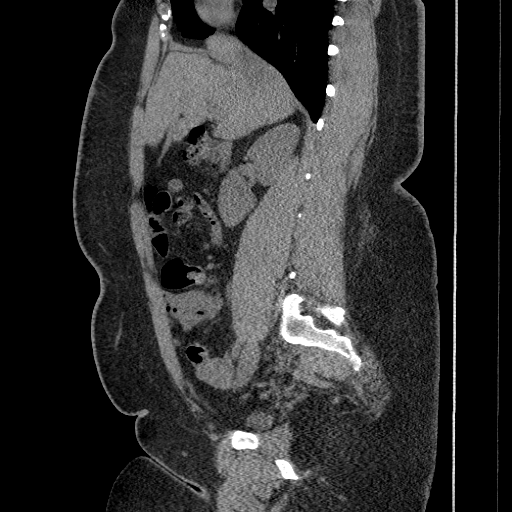
[im 98/187  soft-tissue]
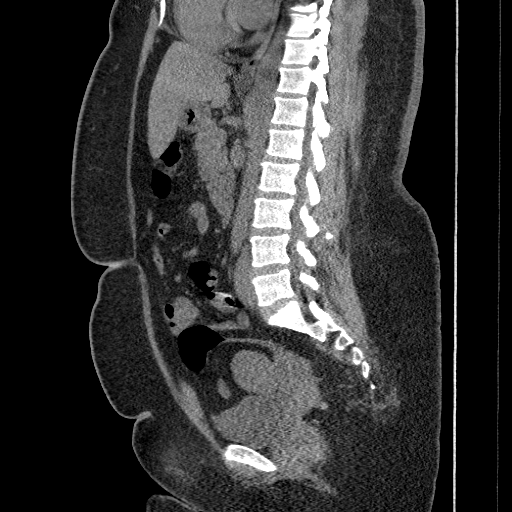
[im 108/187  soft-tissue]
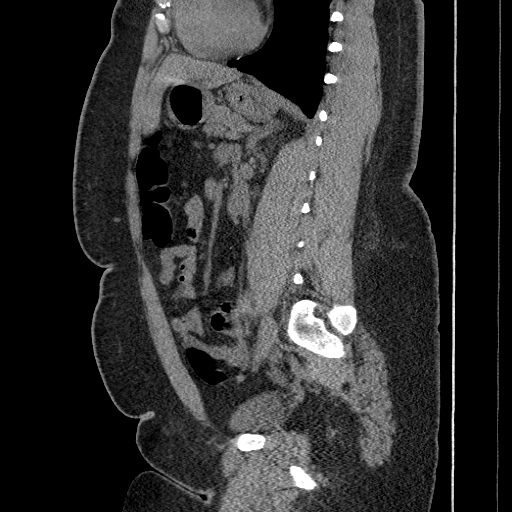
[im 128/187  soft-tissue]
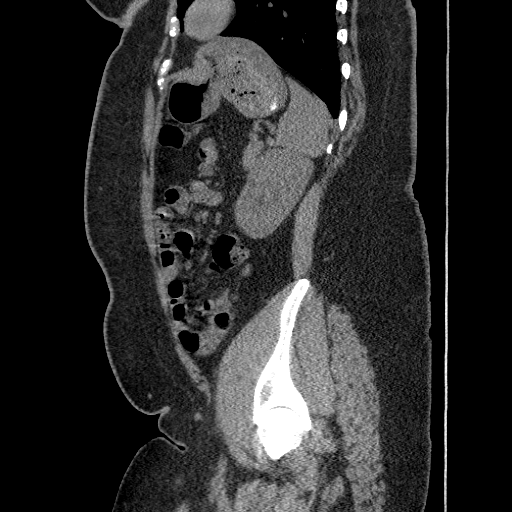
[im 138/187  soft-tissue]
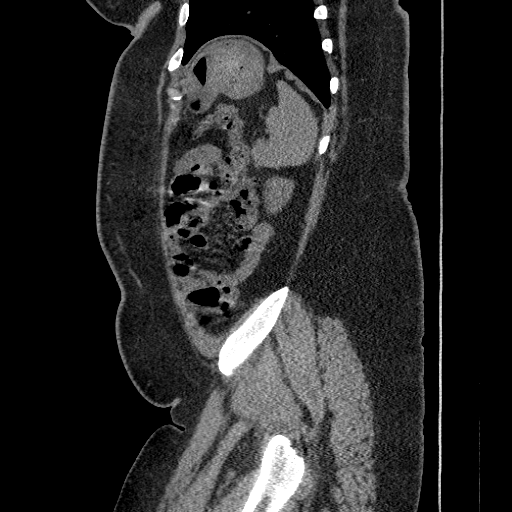
[im 138/187  bone]
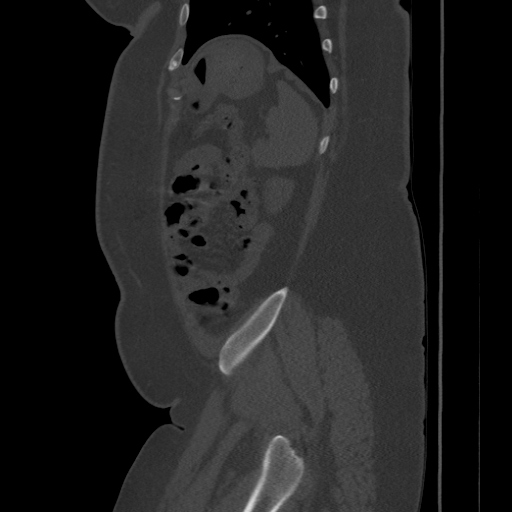
[im 157/187  soft-tissue]
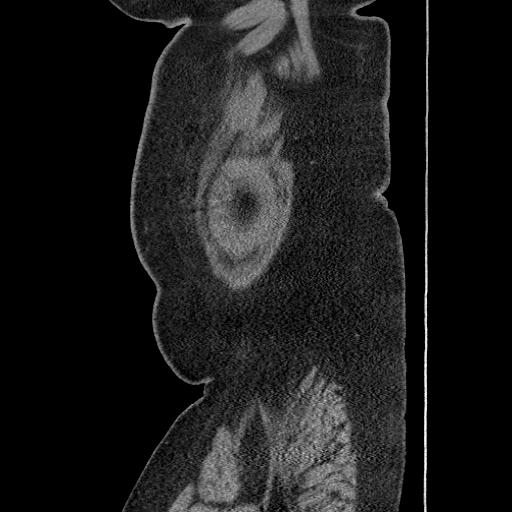
[im 177/187  soft-tissue]
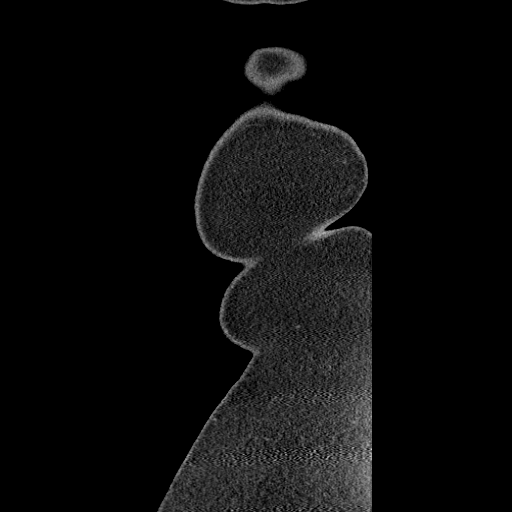

[14 of 36 positions shown; findings below may reference images not displayed]

FINDINGS: Lung bases are normal.

Abdominal images demonstrate a normal liver, spleen, pancreas,
gallbladder and adrenal glands.

Kidneys are normal size without hydronephrosis or nephrolithiasis.
No perinephric inflammation or fluid. Ureters are normal in caliber.
Findings suggest a 2 mm stone over the distal left ureter at the
UVJ. Right ureter is normal.

Vascular structures are unremarkable. Mesentery is within normal.
Stomach and small bowel are within normal. The appendix is normal.
Colon is within normal.

Pelvic images demonstrate the uterus, ovaries, bladder and rectum to
be normal remaining bones and soft tissues are unremarkable.
IMPRESSION: 2 mm stone at the left UVJ without significant obstruction. No
evidence of nephrolithiasis.

## 2019-09-08 ENCOUNTER — Ambulatory Visit
Admission: EM | Admit: 2019-09-08 | Discharge: 2019-09-08 | Disposition: A | Discharge status: Home / Self Care | Payer: 59 | Attending: Family Medicine | Admitting: Family Medicine

## 2019-09-08 DIAGNOSIS — R0981 Nasal congestion: Secondary | ICD-10-CM

## 2019-09-08 DIAGNOSIS — J3089 Other allergic rhinitis: Secondary | ICD-10-CM | POA: Diagnosis not present

## 2019-09-08 DIAGNOSIS — R42 Dizziness and giddiness: Secondary | ICD-10-CM

## 2019-09-08 DIAGNOSIS — J3489 Other specified disorders of nose and nasal sinuses: Secondary | ICD-10-CM

## 2019-09-08 MED ORDER — FLUTICASONE PROPIONATE 50 MCG/ACT NA SUSP
2.0000 | Freq: Every day | NASAL | 2 refills | Status: DC
Start: 2019-09-08 — End: 2020-09-26

## 2019-09-08 MED ORDER — CETIRIZINE-PSEUDOEPHEDRINE ER 5-120 MG PO TB12
1.0000 | ORAL_TABLET | Freq: Every day | ORAL | 0 refills | Status: DC
Start: 2019-09-08 — End: 2020-09-26

## 2019-09-08 NOTE — Discharge Instructions (Addendum)
I have sent in zyrtec d for you to use for congestion and sinus pressure  I have also sent in flonase for you to use 2 sprays daily in each nostril for up to 2 weeks   May use Neti Pot as needed  Follow up with this office or with primary care as needed if you are not feeling better over the next week.

## 2019-09-08 NOTE — ED Triage Notes (Signed)
Pt c/o dizziness since Monday, worse on position change. States now having sinus pressure and rt ear aching.

## 2019-09-09 NOTE — ED Provider Notes (Signed)
[facility]   [id number] 09/08/19 Arrival Time: 1505  RS:WNIO THROAT  SUBJECTIVE: History from: patient.  Kristin Harper is a 44 y.o. female who presents with abrupt onset of nasal congestion, headache, fatigue for 3 days.  Complaining of right ear pain for letter as well.  Denies any discharge from the ear, hearing loss.  Also reports dizziness on position changes, as she feels like she has fluid in her head. Denies/ Admits to sick exposure to Covid, strep, flu or mono, or precipitating event.  Has taken Benadryl once without relief. There are no aggravating factors. Denies previous symptoms in the past.     Denies fever, chills, fatigue,  rhinorrhea, cough, SOB, wheezing, chest pain, nausea, rash, changes in bowel or bladder habits.     ROS: As per HPI.  All other pertinent ROS negative.     Past Medical History:  Diagnosis Date  . Abnormal Pap smear    Over 10 years ago per pt, pre cancerous cells were removed.   . Acid reflux   . Fibroid   . Hx of varicella   . Premenopausal patient 08/05/2017   Past Surgical History:  Procedure Laterality Date  . DILATION AND CURETTAGE OF UTERUS  2005   In [place]  . NASAL SEPTUM SURGERY  2004  . TONSILLECTOMY    . TURBINATE RESECTION  2004   Allergies  Allergen Reactions  . Penicillins Hives and Rash   No current facility-administered medications on file prior to encounter.   Current Outpatient Medications on File Prior to Encounter  Medication Sig Dispense Refill  . acetaZOLAMIDE (DIAMOX) 500 MG capsule Take 500 mg by mouth daily.   3   Social History   Socioeconomic History  . Marital status: Single    Spouse name: Not on file  . Number of children: Not on file  . Years of education: Not on file  . Highest education level: Not on file  Occupational History  . Not on file  Tobacco Use  . Smoking status: Never Smoker  . Smokeless tobacco: Never Used  Substance and Sexual Activity  . Alcohol use: Yes    Comment:  socially  . Drug use: No  . Sexual activity: Yes    Partners: Male    Birth control/protection: Condom  Other Topics Concern  . Not on file  Social History Narrative   g 1 p 0    No tobacco    internet based business   Works at [employer]:   . Difficulty of Paying Living Expenses:   Food Insecurity:   . Worried About Charity fundraiser in the Last Year:   . Arboriculturist in the Last Year:   Transportation Needs:   . Film/video editor (Medical):   Marland Kitchen Lack of Transportation (Non-Medical):   Physical Activity:   . Days of Exercise per Week:   . Minutes of Exercise per Session:   Stress:   . Feeling of Stress :   Social Connections:   . Frequency of Communication with Friends and Family:   . Frequency of Social Gatherings with Friends and Family:   . Attends Religious Services:   . Active Member of Clubs or Organizations:   . Attends Archivist Meetings:   Marland Kitchen Marital Status:   Intimate Partner Violence:   . Fear of Current or Ex-Partner:   . Emotionally Abused:   Marland Kitchen Physically Abused:   .  Sexually Abused:    Family History  Problem Relation Age of Onset  . Heart attack Maternal Aunt   . Hypertension Mother   . Other Father        shot and killed age 44    OBJECTIVE:  Vitals:   09/08/19 1601 09/08/19 1605  BP: 137/90   Pulse: 100   Resp:  18  Temp: 98.3 F (36.8 C)   TempSrc: Oral   SpO2: 98%      General appearance: alert; appears fatigued, but nontoxic, speaking in full sentences and managing own secretions HEENT: NCAT; Ears: EACs clear, TMs pearly gray with visible cone of light, without erythema; Eyes: PERRL, EOMI grossly; Nose: no obvious rhinorrhea; Throat: oropharynx clear, tonsils 1+ and mildly erythematous without white tonsillar exudates, uvula midline Neck: supple without LAD Lungs: CTA bilaterally without adventitious breath sounds; cough absent Heart: regular rate and rhythm.   Radial pulses 2+ symmetrical bilaterally Skin: warm and dry Psychological: alert and cooperative; normal mood and affect  LABS: No results found for this or any previous visit (from the past 24 hour(s)).   ASSESSMENT & PLAN:  1. Allergic rhinitis due to other allergic trigger, unspecified seasonality   2. Nasal congestion   3. Sinus pressure   4. Dizziness and giddiness     Meds ordered this encounter  Medications  . cetirizine-pseudoephedrine (ZYRTEC-D) 5-120 MG tablet    Sig: Take 1 tablet by mouth daily.    Dispense:  30 tablet    Refill:  0    Order Specific Question:   Supervising Provider    Answer:   Chase Picket [id number]  . fluticasone (FLONASE) 50 MCG/ACT nasal spray    Sig: Place 2 sprays into both nostrils daily.    Dispense:  9.9 mL    Refill:  2    Order Specific Question:   Supervising Provider    Answer:   Chase Picket [[id number]]     Push fluids and get rest Prescribed Zyrtec-D Prescribed Flonase May use ibuprofen or Tylenol as needed for headaches, if fever develops may use this as well Drink warm or cool liquids, use throat lozenges, or popsicles to help alleviate symptoms Take OTC ibuprofen or tylenol as needed for pain Follow up with PCP if symptoms persist Return or go to ER if you have any new or worsening symptoms such as fever, chills, nausea, vomiting, worsening sore throat, cough, abdominal pain, chest pain, changes in bowel or bladder habits.   Reviewed expectations re: course of current medical issues. Questions answered. Outlined signs and symptoms indicating need for more acute intervention. Patient verbalized understanding. After Visit Summary given.          Faustino Congress, NP 09/09/19 [phone]

## 2020-09-07 ENCOUNTER — Other Ambulatory Visit: Payer: Self-pay | Admitting: Obstetrics & Gynecology

## 2020-09-26 ENCOUNTER — Other Ambulatory Visit: Payer: Self-pay

## 2020-09-26 ENCOUNTER — Encounter (HOSPITAL_BASED_OUTPATIENT_CLINIC_OR_DEPARTMENT_OTHER): Payer: Self-pay | Admitting: Obstetrics & Gynecology

## 2020-09-26 DIAGNOSIS — E282 Polycystic ovarian syndrome: Secondary | ICD-10-CM

## 2020-09-26 DIAGNOSIS — D259 Leiomyoma of uterus, unspecified: Secondary | ICD-10-CM

## 2020-09-26 HISTORY — DX: Leiomyoma of uterus, unspecified: D25.9

## 2020-09-26 HISTORY — DX: Polycystic ovarian syndrome: E28.2

## 2020-09-26 NOTE — Progress Notes (Addendum)
Spoke w/ via phone for pre-op interview---pt Lab needs dos----urine preg               Lab results------lab appt 10-02-2020 @ 915 for cbc bmp t &s COVID test ----- 10-02-2020 (overnight stay) Arrive at -------1100 am 10-05-2020 NPO after MN NO Solid Food.  Clear liquids from MN until---1000 am then npo Med rec completed Medications to take morning of surgery -----none Diabetic medication -----n/a Patient instructed no nail polish to be worn day of surgery Patient instructed to bring photo id and insurance card day of surgery Patient aware to have Driver (ride ) / caregiver  mother Blanch Media will stay   for 24 hours after surgery  Patient Special Instructions -----pt given overnight stay instructions Pre-Op special Istructions -----none Patient verbalized understanding of instructions that were given at this phone interview. Patient denies shortness of breath, chest pain, fever, cough at this phone interview.   Addendum: spoke with dr c. Woodrum mda and reviewed pt hx and bmi 45.63, pt ok for [facility] per dr Armando Gang mda.

## 2020-09-26 NOTE — Progress Notes (Signed)
YOU ARE SCHEDULED FOR A COVID TEST ON   10-02-2020 . THIS TEST MUST BE DONE BEFORE SURGERY. GO TO  [facility] @ [place] IN YOUR CAR, THIS IS A DRIVE UP TEST. AFTER YOUR COVID TEST , PLEASE WEAR A MASK OUT IN PUBLIC AND SOCIAL DISTANCE AND [place] YOUR HANDS FREQUENTLY. PLEASE ASK ALL YOUR CLOSE HOUSEHOLD CONTACT TO WEAR MASK OUT IN PUBLIC AND SOCIAL DISTANCE AND [place] HANDS FREQUENTLY ALSO.      Your procedure is scheduled on 10-05-2020  Report to [facility].   Call this number if you have problems the morning of surgery  :[phone].   OUR ADDRESS IS [address].  WE ARE LOCATED IN [facility].  PLEASE BRING YOUR INSURANCE CARD AND PHOTO ID DAY OF SURGERY.  ONLY ONE PERSON ALLOWED IN FACILITY WAITING AREA.                                     REMEMBER:  DO NOT EAT FOOD, CANDY GUM OR MINTS  AFTER MIDNIGHT . YOU MAY HAVE CLEAR LIQUIDS FROM MIDNIGHT UNTIL 1000 AM. NO CLEAR LIQUIDS AFTER 1000 DAY OF SURGERY.   YOU MAY  BRUSH YOUR TEETH MORNING OF SURGERY AND RINSE YOUR MOUTH OUT, NO CHEWING GUM CANDY OR MINTS.    CLEAR LIQUID DIET   Foods Allowed                                                                     Foods Excluded  Coffee and tea, regular and decaf                             liquids that you cannot  Plain Jell-O any favor except red or purple                                           see through such as: Fruit ices (not with fruit pulp)                                     milk, soups, orange juice  Iced Popsicles                                    All solid food Carbonated beverages, regular and diet                                    Cranberry, grape and apple juices Sports drinks like Gatorade Lightly seasoned clear broth or consume(fat free) Sugar, honey syrup  Sample Menu Breakfast  Lunch                                      Supper Cranberry juice                    Beef broth                            Chicken broth Jell-O                                     Grape juice                           Apple juice Coffee or tea                        Jell-O                                      Popsicle                                                Coffee or tea                        Coffee or tea  _____________________________________________________________________     TAKE THESE MEDICATIONS MORNING OF SURGERY WITH A SIP OF WATER:  NONE  ONE VISITOR IS ALLOWED IN WAITING ROOM ONLY DAY OF SURGERY.  NO VISITOR MAY SPEND THE NIGHT.  VISITOR ARE ALLOWED TO STAY UNTIL 800 PM.                                    DO NOT WEAR JEWERLY, MAKE UP. DO NOT WEAR LOTIONS, POWDERS, PERFUMES OR NAIL POLISH. DO NOT SHAVE FOR 48 HOURS PRIOR TO DAY OF SURGERY. MEN MAY SHAVE FACE AND NECK. CONTACTS, GLASSES, OR DENTURES MAY NOT BE WORN TO SURGERY.                                    [facility] IS NOT RESPONSIBLE  FOR ANY BELONGINGS.                                                                    Marland Kitchen           [facility] - Preparing for Surgery Before surgery, you can play an important role.  Because skin is not sterile, your skin needs to be as free of germs as possible.  You can reduce the number of germs on your skin by washing with CHG (chlorahexidine gluconate) soap before surgery.  CHG is an  antiseptic cleaner which kills germs and bonds with the skin to continue killing germs even after washing. Please DO NOT use if you have an allergy to CHG or antibacterial soaps.  If your skin becomes reddened/irritated stop using the CHG and inform your nurse when you arrive at Short Stay. Do not shave (including legs and underarms) for at least 48 hours prior to the first CHG shower.  You may shave your face/neck. Please follow these instructions carefully:  1.  Shower with CHG Soap the night before surgery and the  morning of Surgery.  2.   If you choose to wash your hair, wash your hair first as usual with your  normal  shampoo.  3.  After you shampoo, rinse your hair and body thoroughly to remove the  shampoo.                            4.  Use CHG as you would any other liquid soap.  You can apply chg directly  to the skin and wash                      Gently with a scrungie or clean washcloth.  5.  Apply the CHG Soap to your body ONLY FROM THE NECK DOWN.   Do not use on face/ open                           Wound or open sores. Avoid contact with eyes, ears mouth and genitals (private parts).                       Wash face,  Genitals (private parts) with your normal soap.             6.  Wash thoroughly, paying special attention to the area where your surgery  will be performed.  7.  Thoroughly rinse your body with warm water from the neck down.  8.  DO NOT shower/wash with your normal soap after using and rinsing off  the CHG Soap.                9.  Pat yourself dry with a clean towel.            10.  Wear clean pajamas.            11.  Place clean sheets on your bed the night of your first shower and do not  sleep with pets. Day of Surgery : Do not apply any lotions/deodorants the morning of surgery.  Please wear clean clothes to the hospital/surgery center.  FAILURE TO FOLLOW THESE INSTRUCTIONS MAY RESULT IN THE CANCELLATION OF YOUR SURGERY PATIENT SIGNATURE_________________________________  NURSE SIGNATURE__________________________________  ________________________________________________________________________                                                        QUESTIONS Kristin Harper PRE OP NURSE PHONE [phone].

## 2020-10-02 ENCOUNTER — Other Ambulatory Visit: Payer: Self-pay | Admitting: Obstetrics & Gynecology

## 2020-10-02 ENCOUNTER — Encounter (HOSPITAL_COMMUNITY)
Admission: RE | Admit: 2020-10-02 | Discharge: 2020-10-02 | Disposition: A | Discharge status: Home / Self Care | Payer: 59 | Source: Ambulatory Visit | Attending: Obstetrics & Gynecology | Admitting: Obstetrics & Gynecology

## 2020-10-02 ENCOUNTER — Other Ambulatory Visit: Payer: Self-pay

## 2020-10-02 DIAGNOSIS — Z01812 Encounter for preprocedural laboratory examination: Secondary | ICD-10-CM | POA: Insufficient documentation

## 2020-10-02 LAB — CBC
HCT: 36.6 % (ref 36.0–46.0)
Hemoglobin: 10.3 g/dL — ABNORMAL LOW (ref 12.0–15.0)
MCH: 19.4 pg — ABNORMAL LOW (ref 26.0–34.0)
MCHC: 28.1 g/dL — ABNORMAL LOW (ref 30.0–36.0)
MCV: 69.1 fL — ABNORMAL LOW (ref 80.0–100.0)
Platelets: 356 10*3/uL (ref 150–400)
RBC: 5.3 MIL/uL — ABNORMAL HIGH (ref 3.87–5.11)
RDW: 17.3 % — ABNORMAL HIGH (ref 11.5–15.5)
WBC: 6.1 10*3/uL (ref 4.0–10.5)
nRBC: 0 % (ref 0.0–0.2)

## 2020-10-02 LAB — BASIC METABOLIC PANEL
Anion gap: 6 (ref 5–15)
BUN: 8 mg/dL (ref 6–20)
CO2: 21 mmol/L — ABNORMAL LOW (ref 22–32)
Calcium: 9.2 mg/dL (ref 8.9–10.3)
Chloride: 110 mmol/L (ref 98–111)
Creatinine, Ser: 0.64 mg/dL (ref 0.44–1.00)
GFR, Estimated: >60 mL/min (ref >60)
Glucose, Bld: 101 mg/dL — ABNORMAL HIGH (ref 70–99)
Potassium: 3.7 mmol/L (ref 3.5–5.1)
Sodium: 137 mmol/L (ref 135–145)

## 2020-10-03 LAB — SARS CORONAVIRUS 2 (TAT 6-24 HRS): SARS Coronavirus 2: NEGATIVE

## 2020-10-04 NOTE — H&P (Addendum)
Kristin Harper is an 45 y.o. female G1P0010 with a long history of fibroids, dysmenorrhea, menorrhagia and anemia who has failed conservative treatment of medication  (ibuprofen, tranexamic acid) and dilation and currettage hysteroscopy, now desiring definitive therapy via a hysterectomy.  She also desires to decrease her future risks of fallopian tube and ovarian cancer and is for opportunitc removal of both fallopian tubes.  She understands that cystoscopy will be performed at the end of the procedure to check the bladder.  She expressed understanding that a hysterectomy would cause sterilization as she will be unable to carry a pregnancy herself after the surgery.  Her benign intracranial hypertension has been well controlled with diamox and she obtained a surgical clearance from her ophthalmologist recently.   Pertinent Gynecological History: Menses:  heavy, regular, monthly Bleeding: Heavy periods, uses more than 1 pad in 1 hour when flow is heavy.  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   Past Medical History:  Diagnosis Date   Abnormal Pap smear    Over 10 years ago per pt, pre cancerous cells were removed.    Acid reflux    Anemia    Fibroid    Hx of varicella    as child   PCOS (polycystic ovarian syndrome) 09/26/2020   Premenopausal patient 08/05/2017   Pseudotumor cerebri    takes diamox for, monitored by dr Legrand Como spencer md   Uterine fibroid 09/26/2020  Hidradenitis Kidney stones Benign Intracranial Hypertension.   Past Surgical History:  Procedure Laterality Date   DILATION AND CURETTAGE OF UTERUS  02/19/2003   In [place] SURGERY   02/18/2002   TONSILLECTOMY     2004, turbinate reduction septal reconstruction also   TURBINATE RESECTION  02/18/2002    Family History  Problem Relation Age of Onset   Heart attack Maternal Aunt    Hypertension Mother    Other Father        shot and killed age 56    Social History:  reports that she has never smoked. She has never used smokeless tobacco. She reports that she does not currently use alcohol. She reports that she does not use drugs.  Allergies:  Allergies  Allergen Reactions   Penicillins Hives and Rash    No current facility-administered medications for this encounter.  Current Outpatient Medications:    OVER THE COUNTER MEDICATION, Vita fusion iron 1 tab daily, Disp: , Rfl:    acetaZOLAMIDE (DIAMOX) 500 MG capsule, Take 500 mg by mouth daily. Is diamox er per pt, Disp: , Rfl: 3   Review of Systems Constitutional: Denies fevers/chills Cardiovascular: Denies chest pain or palpitations Pulmonary: Denies coughing or wheezing Gastrointestinal: Denies nausea, vomiting or diarrhea Genitourinary: Denies pelvic pain, unusual vaginal discharge, dysuria, urgency or frequency. With unusual vaginal bleeding.   Musculoskeletal: Denies muscle or joint aches and pain.  Neurology: Denies abnormal sensations such as tingling or numbness.    Height 5' 6.5" (1.689 m), weight 130.2 kg, last menstrual period 09/10/2020.  BMI 44.6 Vitals with BMI 10/05/2020 10/03/2020 09/26/2020  Height 5' 6.5" 5' 6.5" 5' 6.5"  Weight 280 lbs 14 oz 280 lbs 287 lbs  BMI 44.66 [id number]  Systolic  [id number] -  Diastolic 98 90 -  Pulse [id number] 86 -    Physical Exam Constitutional: She is oriented to person, place, and time. She appears well-developed and well-nourished.  HENT:  Head: Normocephalic and atraumatic.  Neck: Normal range of motion.  Cardiovascular: Normal rate, regular rhythm and normal heart sounds.   Respiratory: Effort normal and breath sounds normal.  GI: Soft. Bowel sounds are  normal.  Neurological: She is alert and oriented to person, place, and time.  Skin: Skin is warm and dry.  Psychiatric: She has a normal mood and affect. Her behavior is normal.    Recent Results (from the past 2160 hour(s))  SARS Coronavirus 2 (TAT 6-24 hrs)     Status: None   Collection Time: 10/02/20 12:00 AM  Result Value Ref Range   SARS Coronavirus 2 RESULT: NEGATIVE     Comment: RESULT: NEGATIVESARS-CoV-2 INTERPRETATION:A NEGATIVE  test result means that SARS-CoV-2 RNA was not present in the specimen above the limit of detection of this test. This does not preclude a possible SARS-CoV-2 infection and should not be used as the  sole basis for patient management decisions. Negative results must be combined with clinical observations, patient history, and epidemiological information. Optimum specimen types and timing for peak viral levels during infections caused by SARS-CoV-2  have not been determined. Collection of multiple specimens or types of specimens may be necessary to detect virus. Improper specimen collection and handling, sequence variability under primers/probes, or organism present below the limit of detection may  lead to false negative results. Positive and negative predictive values of testing are highly dependent on prevalence. False negative test results are more likely when prevalence of disease is high.The expected result is NEGATIVE.Fact S heet for  Healthcare Providers: LocalChronicle.no Sheet for Patients: SalonLookup.es Reference Range - Negative   Type and screen     Status: None   Collection Time: 10/02/20  9:45 AM  Result Value Ref Range   ABO/RH(D) B POS    Antibody Screen NEG    Sample Expiration 10/16/2020,2359    Extend sample reason      NO TRANSFUSIONS OR PREGNANCY IN THE PAST 3 MONTHS Performed at [facility], [place] [address], [place], [place] [address]   CBC     Status:  Abnormal   Collection Time: 10/02/20 10:00 AM  Result Value Ref Range   WBC 6.1 4.0 - 10.5 K/uL   RBC 5.30 (H) 3.87 - 5.11 MIL/uL   Hemoglobin 10.3 (L) 12.0 - 15.0 g/dL   HCT 36.6 36.0 - 46.0 %   MCV 69.1 (L) 80.0 - 100.0 fL   MCH 19.4 (L) 26.0 - 34.0 pg   MCHC 28.1 (L) 30.0 - 36.0 g/dL   RDW 17.3 (H) 11.5 - 15.5 %   Platelets 356 150 - 400 K/uL   nRBC 0.0 0.0 - 0.2 %    Comment: Performed at [facility], [place] [address] [place], [place] [id number]  Basic metabolic panel     Status: Abnormal   Collection Time: 10/02/20 10:00 AM  Result Value Ref Range   Sodium 137 135 - 145 mmol/L   Potassium 3.7 3.5 - 5.1 mmol/L   Chloride 110 98 - 111 mmol/L   CO2 21 (L) 22 - 32 mmol/L   Glucose, Bld 101 (H) 70 - 99 mg/dL    Comment: Glucose reference range applies only to samples taken after fasting for at least 8 hours.   BUN 8 6 -  20 mg/dL   Creatinine, Ser 0.64 0.44 - 1.00 mg/dL   Calcium 9.2 8.9 - 10.3 mg/dL   GFR, Estimated >60 >60 mL/min    Comment: (NOTE) Calculated using the CKD-EPI Creatinine Equation (2021)    Anion gap 6 5 - 15    Comment: Performed at [facility] [place] [address], [place], [place] [address]    Pelvic Ultrasound: 03/03/2020: Uterus 10.8 x 6.6 x 7.6 cm.  198 mls.Anterior left intramural fibroid measuring 3.4x 2.7 x 3.4 cm.  Lower uterine segment fibroid with intramural and submucosal components measuring 2.2 x 2.1 x 2.2 cm.  EMS 7 mm. Left ovary appears normal, 15.5 mls.  Right ovary appears normal, 11.5 mls. No pelvic free fluid.   Assessment/Plan: 45 y/o with symptomatic fibroids, failed conservative therapy and desires definitive therapy via a hysterectomy and opportunistic removal of both fallopian tubes, and cystoscopy.   These procedures has been fully reviewed with the patient and written informed consent has been obtained.  We discussed risks including but not limited to heavy bleeding, infection and damage to organs.    She would like conservation of her ovaries if they are normal but removal of any abnormal lesions if present or removal of ovaries if deemed necessary.  In the case of heavy bleeding requiring transfusion patient would accept blood products.    Archie Endo, MD.  10/04/2020, 4:24 PM

## 2020-10-05 ENCOUNTER — Encounter (HOSPITAL_BASED_OUTPATIENT_CLINIC_OR_DEPARTMENT_OTHER)
Admission: RE | Disposition: A | Discharge status: Home / Self Care | Payer: Self-pay | Source: Home / Self Care | Attending: Obstetrics & Gynecology

## 2020-10-05 ENCOUNTER — Encounter (HOSPITAL_BASED_OUTPATIENT_CLINIC_OR_DEPARTMENT_OTHER): Payer: Self-pay | Admitting: Obstetrics & Gynecology

## 2020-10-05 ENCOUNTER — Other Ambulatory Visit: Payer: Self-pay

## 2020-10-05 ENCOUNTER — Ambulatory Visit (HOSPITAL_BASED_OUTPATIENT_CLINIC_OR_DEPARTMENT_OTHER): Payer: 59 | Admitting: Anesthesiology

## 2020-10-05 ENCOUNTER — Ambulatory Visit (HOSPITAL_BASED_OUTPATIENT_CLINIC_OR_DEPARTMENT_OTHER)
Admission: RE | Admit: 2020-10-05 | Discharge: 2020-10-06 | Disposition: A | Discharge status: Home / Self Care | Payer: 59 | Attending: Obstetrics & Gynecology | Admitting: Obstetrics & Gynecology

## 2020-10-05 DIAGNOSIS — D259 Leiomyoma of uterus, unspecified: Secondary | ICD-10-CM | POA: Insufficient documentation

## 2020-10-05 DIAGNOSIS — Z6841 Body Mass Index (BMI) 40.0 and over, adult: Secondary | ICD-10-CM | POA: Diagnosis not present

## 2020-10-05 DIAGNOSIS — N814 Uterovaginal prolapse, unspecified: Secondary | ICD-10-CM | POA: Insufficient documentation

## 2020-10-05 DIAGNOSIS — G932 Benign intracranial hypertension: Secondary | ICD-10-CM | POA: Insufficient documentation

## 2020-10-05 DIAGNOSIS — N841 Polyp of cervix uteri: Secondary | ICD-10-CM | POA: Diagnosis not present

## 2020-10-05 DIAGNOSIS — N946 Dysmenorrhea, unspecified: Secondary | ICD-10-CM | POA: Insufficient documentation

## 2020-10-05 DIAGNOSIS — D251 Intramural leiomyoma of uterus: Secondary | ICD-10-CM | POA: Diagnosis present

## 2020-10-05 DIAGNOSIS — Z880 Allergy status to penicillin: Secondary | ICD-10-CM | POA: Insufficient documentation

## 2020-10-05 DIAGNOSIS — Z8249 Family history of ischemic heart disease and other diseases of the circulatory system: Secondary | ICD-10-CM | POA: Diagnosis not present

## 2020-10-05 DIAGNOSIS — D649 Anemia, unspecified: Secondary | ICD-10-CM | POA: Diagnosis not present

## 2020-10-05 DIAGNOSIS — Z298 Encounter for other specified prophylactic measures: Secondary | ICD-10-CM | POA: Diagnosis not present

## 2020-10-05 DIAGNOSIS — D219 Benign neoplasm of connective and other soft tissue, unspecified: Secondary | ICD-10-CM | POA: Diagnosis present

## 2020-10-05 HISTORY — PX: CYSTOSCOPY: SHX5120

## 2020-10-05 HISTORY — DX: Benign intracranial hypertension: G93.2

## 2020-10-05 HISTORY — DX: Anemia, unspecified: D64.9

## 2020-10-05 HISTORY — PX: TOTAL LAPAROSCOPIC HYSTERECTOMY WITH SALPINGECTOMY: SHX6742

## 2020-10-05 LAB — TYPE AND SCREEN
ABO/RH(D): B POS
Antibody Screen: NEGATIVE

## 2020-10-05 LAB — POCT PREGNANCY, URINE: Preg Test, Ur: NEGATIVE

## 2020-10-05 LAB — ABO/RH: ABO/RH(D): B POS

## 2020-10-05 SURGERY — HYSTERECTOMY, TOTAL, LAPAROSCOPIC, WITH SALPINGECTOMY
Anesthesia: General | Site: Urethra | Laterality: Bilateral

## 2020-10-05 MED ORDER — KETAMINE HCL 50 MG/5ML IJ SOSY
PREFILLED_SYRINGE | INTRAMUSCULAR | Status: AC
  Filled 2020-10-05: qty 5

## 2020-10-05 MED ORDER — BISACODYL 5 MG PO TBEC
5.0000 mg | DELAYED_RELEASE_TABLET | Freq: Every day | ORAL | Status: DC | PRN
  Filled 2020-10-05: qty 1

## 2020-10-05 MED ORDER — LIDOCAINE 2% (20 MG/ML) 5 ML SYRINGE
INTRAMUSCULAR | Status: DC | PRN
  Administered 2020-10-05: 100 mg via INTRAVENOUS

## 2020-10-05 MED ORDER — ACETAMINOPHEN 500 MG PO TABS
ORAL_TABLET | ORAL | Status: AC
  Filled 2020-10-05: qty 2

## 2020-10-05 MED ORDER — HYDROMORPHONE HCL 1 MG/ML IJ SOLN
INTRAMUSCULAR | Status: AC
  Filled 2020-10-05: qty 1

## 2020-10-05 MED ORDER — FENTANYL CITRATE (PF) 250 MCG/5ML IJ SOLN
INTRAMUSCULAR | Status: AC
  Filled 2020-10-05: qty 5

## 2020-10-05 MED ORDER — ROCURONIUM BROMIDE 10 MG/ML (PF) SYRINGE
PREFILLED_SYRINGE | INTRAVENOUS | Status: DC | PRN
  Administered 2020-10-05 (×2): 20 mg via INTRAVENOUS
  Administered 2020-10-05: 70 mg via INTRAVENOUS

## 2020-10-05 MED ORDER — ONDANSETRON HCL 4 MG/2ML IJ SOLN
INTRAMUSCULAR | Status: AC
  Filled 2020-10-05: qty 2

## 2020-10-05 MED ORDER — KETOROLAC TROMETHAMINE 30 MG/ML IJ SOLN
INTRAMUSCULAR | Status: DC | PRN
  Administered 2020-10-05: 30 mg via INTRAVENOUS

## 2020-10-05 MED ORDER — IBUPROFEN 800 MG PO TABS
ORAL_TABLET | ORAL | Status: AC
  Filled 2020-10-05: qty 1

## 2020-10-05 MED ORDER — OXYCODONE HCL 5 MG PO TABS
ORAL_TABLET | ORAL | Status: AC
  Filled 2020-10-05: qty 1

## 2020-10-05 MED ORDER — PROMETHAZINE HCL 25 MG/ML IJ SOLN
6.2500 mg | INTRAMUSCULAR | Status: DC | PRN
Start: 2020-10-05 — End: 2020-10-06

## 2020-10-05 MED ORDER — ONDANSETRON HCL 4 MG PO TABS: 4.0000 mg | ORAL_TABLET | Freq: Four times a day (QID) | ORAL | Status: DC | PRN

## 2020-10-05 MED ORDER — FENTANYL CITRATE (PF) 100 MCG/2ML IJ SOLN
INTRAMUSCULAR | Status: DC | PRN
  Administered 2020-10-05: 100 ug via INTRAVENOUS
  Administered 2020-10-05 (×2): 50 ug via INTRAVENOUS

## 2020-10-05 MED ORDER — OXYCODONE HCL 5 MG/5ML PO SOLN: 5.0000 mg | Freq: Once | ORAL | Status: AC | PRN

## 2020-10-05 MED ORDER — GLYCOPYRROLATE PF 0.2 MG/ML IJ SOSY
PREFILLED_SYRINGE | INTRAMUSCULAR | Status: DC | PRN
  Administered 2020-10-05: .2 mg via INTRAVENOUS

## 2020-10-05 MED ORDER — DOCUSATE SODIUM 100 MG PO CAPS
ORAL_CAPSULE | ORAL | Status: AC
  Filled 2020-10-05: qty 1

## 2020-10-05 MED ORDER — SODIUM CHLORIDE 0.9 % IR SOLN
Status: DC | PRN
  Administered 2020-10-05 (×2): 1000 mL

## 2020-10-05 MED ORDER — ONDANSETRON HCL 4 MG/2ML IJ SOLN
INTRAMUSCULAR | Status: DC | PRN
Start: 2020-10-05 — End: 2020-10-05
  Administered 2020-10-05: 4 mg via INTRAVENOUS

## 2020-10-05 MED ORDER — PHENYLEPHRINE 40 MCG/ML (10ML) SYRINGE FOR IV PUSH (FOR BLOOD PRESSURE SUPPORT)
PREFILLED_SYRINGE | INTRAVENOUS | Status: DC | PRN
  Administered 2020-10-05: 80 ug via INTRAVENOUS
  Administered 2020-10-05: 40 ug via INTRAVENOUS
  Administered 2020-10-05 (×2): 80 ug via INTRAVENOUS
  Administered 2020-10-05: 40 ug via INTRAVENOUS
  Administered 2020-10-05: 80 ug via INTRAVENOUS

## 2020-10-05 MED ORDER — GENTAMICIN SULFATE 40 MG/ML IJ SOLN
440.0000 mg | INTRAVENOUS | Status: AC
  Administered 2020-10-05: 440 mg via INTRAVENOUS
  Filled 2020-10-05: qty 11

## 2020-10-05 MED ORDER — DEXAMETHASONE SODIUM PHOSPHATE 10 MG/ML IJ SOLN
INTRAMUSCULAR | Status: DC | PRN
  Administered 2020-10-05: 10 mg via INTRAVENOUS

## 2020-10-05 MED ORDER — DOCUSATE SODIUM 100 MG PO CAPS
100.0000 mg | ORAL_CAPSULE | Freq: Two times a day (BID) | ORAL | Status: DC | PRN
  Administered 2020-10-05: 100 mg via ORAL

## 2020-10-05 MED ORDER — KETOROLAC TROMETHAMINE 30 MG/ML IJ SOLN
INTRAMUSCULAR | Status: AC
  Filled 2020-10-05: qty 2

## 2020-10-05 MED ORDER — KETOROLAC TROMETHAMINE 30 MG/ML IJ SOLN
INTRAMUSCULAR | Status: AC
  Filled 2020-10-05: qty 1

## 2020-10-05 MED ORDER — ACETAZOLAMIDE ER 500 MG PO CP12
500.0000 mg | ORAL_CAPSULE | Freq: Every day | ORAL | Status: DC
  Filled 2020-10-05: qty 1

## 2020-10-05 MED ORDER — SIMETHICONE 80 MG PO CHEW: 80.0000 mg | CHEWABLE_TABLET | Freq: Four times a day (QID) | ORAL | Status: DC | PRN

## 2020-10-05 MED ORDER — OXYCODONE HCL 5 MG PO TABS
5.0000 mg | ORAL_TABLET | Freq: Once | ORAL | Status: AC | PRN
  Administered 2020-10-05: 5 mg via ORAL

## 2020-10-05 MED ORDER — KETOROLAC TROMETHAMINE 30 MG/ML IJ SOLN: 30.0000 mg | Freq: Four times a day (QID) | INTRAMUSCULAR | Status: DC | PRN

## 2020-10-05 MED ORDER — DEXAMETHASONE SODIUM PHOSPHATE 10 MG/ML IJ SOLN
INTRAMUSCULAR | Status: AC
  Filled 2020-10-05: qty 1

## 2020-10-05 MED ORDER — OXYCODONE HCL 5 MG PO TABS
5.0000 mg | ORAL_TABLET | ORAL | Status: DC | PRN
  Administered 2020-10-05: 10 mg via ORAL
  Administered 2020-10-06 (×2): 5 mg via ORAL

## 2020-10-05 MED ORDER — MENTHOL 3 MG MT LOZG
1.0000 | LOZENGE | OROMUCOSAL | Status: DC | PRN
  Administered 2020-10-05: 3 mg via ORAL

## 2020-10-05 MED ORDER — SUGAMMADEX SODIUM 200 MG/2ML IV SOLN
INTRAVENOUS | Status: DC | PRN
  Administered 2020-10-05: 200 mg via INTRAVENOUS

## 2020-10-05 MED ORDER — PROPOFOL 10 MG/ML IV BOLUS
INTRAVENOUS | Status: DC | PRN
  Administered 2020-10-05: 200 mg via INTRAVENOUS

## 2020-10-05 MED ORDER — LIDOCAINE HCL 2 % IJ SOLN
INTRAMUSCULAR | Status: AC
  Filled 2020-10-05: qty 20

## 2020-10-05 MED ORDER — HYDROMORPHONE HCL 1 MG/ML IJ SOLN
0.2000 mg | INTRAMUSCULAR | Status: DC | PRN
  Administered 2020-10-05: 0.5 mg via INTRAVENOUS

## 2020-10-05 MED ORDER — POVIDONE-IODINE 10 % EX SWAB
2.0000 | Freq: Once | CUTANEOUS | Status: DC
Start: 2020-10-05 — End: 2020-10-06

## 2020-10-05 MED ORDER — LACTATED RINGERS IV SOLN: INTRAVENOUS | Status: DC

## 2020-10-05 MED ORDER — CLINDAMYCIN PHOSPHATE 900 MG/50ML IV SOLN
900.0000 mg | INTRAVENOUS | Status: AC
  Administered 2020-10-05: 900 mg via INTRAVENOUS

## 2020-10-05 MED ORDER — IBUPROFEN 800 MG PO TABS
800.0000 mg | ORAL_TABLET | Freq: Three times a day (TID) | ORAL | Status: DC
  Administered 2020-10-05 – 2020-10-06 (×2): 800 mg via ORAL

## 2020-10-05 MED ORDER — KETAMINE HCL 10 MG/ML IJ SOLN
INTRAMUSCULAR | Status: DC | PRN
  Administered 2020-10-05: 40 mg via INTRAVENOUS

## 2020-10-05 MED ORDER — ROCURONIUM BROMIDE 10 MG/ML (PF) SYRINGE
PREFILLED_SYRINGE | INTRAVENOUS | Status: AC
  Filled 2020-10-05: qty 10

## 2020-10-05 MED ORDER — LIDOCAINE HCL (PF) 2 % IJ SOLN
INTRAMUSCULAR | Status: AC
  Filled 2020-10-05: qty 5

## 2020-10-05 MED ORDER — SCOPOLAMINE 1 MG/3DAYS TD PT72
MEDICATED_PATCH | TRANSDERMAL | Status: AC
  Filled 2020-10-05: qty 1

## 2020-10-05 MED ORDER — ACETAMINOPHEN 500 MG PO TABS
1000.0000 mg | ORAL_TABLET | Freq: Once | ORAL | Status: AC
  Administered 2020-10-05: 1000 mg via ORAL

## 2020-10-05 MED ORDER — SENNOSIDES-DOCUSATE SODIUM 8.6-50 MG PO TABS
1.0000 | ORAL_TABLET | Freq: Every evening | ORAL | Status: DC | PRN
  Filled 2020-10-05: qty 1

## 2020-10-05 MED ORDER — FENTANYL CITRATE (PF) 100 MCG/2ML IJ SOLN: 25.0000 ug | INTRAMUSCULAR | Status: DC | PRN

## 2020-10-05 MED ORDER — MIDAZOLAM HCL 2 MG/2ML IJ SOLN
INTRAMUSCULAR | Status: AC
  Filled 2020-10-05: qty 2

## 2020-10-05 MED ORDER — PHENYLEPHRINE HCL (PRESSORS) 10 MG/ML IV SOLN
INTRAVENOUS | Status: AC
  Filled 2020-10-05: qty 1

## 2020-10-05 MED ORDER — GLYCOPYRROLATE PF 0.2 MG/ML IJ SOSY
PREFILLED_SYRINGE | INTRAMUSCULAR | Status: AC
  Filled 2020-10-05: qty 1

## 2020-10-05 MED ORDER — MIDAZOLAM HCL 5 MG/5ML IJ SOLN
INTRAMUSCULAR | Status: DC | PRN
  Administered 2020-10-05: 2 mg via INTRAVENOUS

## 2020-10-05 MED ORDER — ALBUMIN HUMAN 5 % IV SOLN
INTRAVENOUS | Status: AC
  Filled 2020-10-05: qty 500

## 2020-10-05 MED ORDER — INDIGOTINDISULFONATE SODIUM 8 MG/ML IJ SOLN
INTRAMUSCULAR | Status: DC | PRN
  Administered 2020-10-05: 40 mg via INTRAVENOUS

## 2020-10-05 MED ORDER — PHENYLEPHRINE 40 MCG/ML (10ML) SYRINGE FOR IV PUSH (FOR BLOOD PRESSURE SUPPORT)
PREFILLED_SYRINGE | INTRAVENOUS | Status: AC
  Filled 2020-10-05: qty 10

## 2020-10-05 MED ORDER — CLINDAMYCIN PHOSPHATE 900 MG/50ML IV SOLN
INTRAVENOUS | Status: AC
  Filled 2020-10-05: qty 50

## 2020-10-05 MED ORDER — ACETAMINOPHEN 500 MG PO TABS
1000.0000 mg | ORAL_TABLET | Freq: Four times a day (QID) | ORAL | Status: DC
  Administered 2020-10-06: 1000 mg via ORAL

## 2020-10-05 MED ORDER — SCOPOLAMINE 1 MG/3DAYS TD PT72
1.0000 | MEDICATED_PATCH | Freq: Once | TRANSDERMAL | Status: DC
  Administered 2020-10-05: 1.5 mg via TRANSDERMAL

## 2020-10-05 MED ORDER — PANTOPRAZOLE SODIUM 40 MG IV SOLR
INTRAVENOUS | Status: AC
  Filled 2020-10-05: qty 40

## 2020-10-05 MED ORDER — 0.9 % SODIUM CHLORIDE (POUR BTL) OPTIME
TOPICAL | Status: DC | PRN
  Administered 2020-10-05: 1000 mL

## 2020-10-05 MED ORDER — LIDOCAINE HCL (PF) 2 % IJ SOLN
INTRAMUSCULAR | Status: DC | PRN
Start: 2020-10-05 — End: 2020-10-05
  Administered 2020-10-05: 1.5 mg/kg/h via INTRADERMAL

## 2020-10-05 MED ORDER — MENTHOL 3 MG MT LOZG
LOZENGE | OROMUCOSAL | Status: AC
  Filled 2020-10-05: qty 9

## 2020-10-05 MED ORDER — LACTATED RINGERS IV SOLN
INTRAVENOUS | Status: DC
  Administered 2020-10-05: 1000 mL via INTRAVENOUS

## 2020-10-05 MED ORDER — LACTATED RINGERS IV SOLN: Freq: Once | INTRAVENOUS | Status: AC

## 2020-10-05 MED ORDER — PROPOFOL 10 MG/ML IV BOLUS
INTRAVENOUS | Status: AC
  Filled 2020-10-05: qty 20

## 2020-10-05 MED ORDER — BUPIVACAINE HCL (PF) 0.25 % IJ SOLN
INTRAMUSCULAR | Status: DC | PRN
  Administered 2020-10-05: 10 mL

## 2020-10-05 MED ORDER — FLEET ENEMA 7-19 GM/118ML RE ENEM: 1.0000 | ENEMA | Freq: Once | RECTAL | Status: DC | PRN

## 2020-10-05 MED ORDER — PANTOPRAZOLE SODIUM 40 MG IV SOLR
40.0000 mg | Freq: Every day | INTRAVENOUS | Status: DC
  Administered 2020-10-05: 40 mg via INTRAVENOUS

## 2020-10-05 SURGICAL SUPPLY — 55 items
ADH SKN CLS APL DERMABOND .7 (GAUZE/BANDAGES/DRESSINGS) ×2
APL SRG 38 LTWT LNG FL B (MISCELLANEOUS) ×2
APL SWBSTK 6 STRL LF DISP (MISCELLANEOUS) ×2
APPLICATOR ARISTA FLEXITIP XL (MISCELLANEOUS) ×3 IMPLANT
APPLICATOR COTTON TIP 6 STRL (MISCELLANEOUS) ×2 IMPLANT
APPLICATOR COTTON TIP 6IN STRL (MISCELLANEOUS) ×3
BARRIER ADHS 3X4 INTERCEED (GAUZE/BANDAGES/DRESSINGS) ×3 IMPLANT
BRR ADH 4X3 ABS CNTRL BYND (GAUZE/BANDAGES/DRESSINGS) ×2
COVER MAYO STAND STRL (DRAPES) ×3 IMPLANT
DERMABOND ADVANCED (GAUZE/BANDAGES/DRESSINGS) ×1
DERMABOND ADVANCED .7 DNX12 (GAUZE/BANDAGES/DRESSINGS) ×2 IMPLANT
DURAPREP 26ML APPLICATOR (WOUND CARE) ×3 IMPLANT
GAUZE 4X4 16PLY ~~LOC~~+RFID DBL (SPONGE) ×6 IMPLANT
GLOVE SURG ENC MOIS LTX SZ6.5 (GLOVE) ×6 IMPLANT
GLOVE SURG ENC MOIS LTX SZ7 (GLOVE) ×3 IMPLANT
GLOVE SURG POLYISO LF SZ6.5 (GLOVE) ×3 IMPLANT
GLOVE SURG UNDER POLY LF SZ6.5 (GLOVE) ×6 IMPLANT
GLOVE SURG UNDER POLY LF SZ7 (GLOVE) ×12 IMPLANT
GOWN STRL REUS W/TWL LRG LVL3 (GOWN DISPOSABLE) ×9 IMPLANT
HEMOSTAT ARISTA ABSORB 3G PWDR (HEMOSTASIS) ×3 IMPLANT
HOLDER FOLEY CATH W/STRAP (MISCELLANEOUS) ×3 IMPLANT
IV NS 1000ML (IV SOLUTION) ×6
IV NS 1000ML BAXH (IV SOLUTION) ×4 IMPLANT
KIT TURNOVER CYSTO (KITS) ×3 IMPLANT
LIGASURE LAP L-HOOK RET 5 37 (INSTRUMENTS) ×3 IMPLANT
MANIPULATOR ADVINCU DEL 4.0 PL (MISCELLANEOUS) ×3 IMPLANT
NEEDLE INSUFFLATION 120MM (ENDOMECHANICALS) ×3 IMPLANT
NS IRRIG 1000ML POUR BTL (IV SOLUTION) ×3 IMPLANT
PACK LAPAROSCOPY BASIN (CUSTOM PROCEDURE TRAY) ×3 IMPLANT
PACK TRENDGUARD 600 HYBRD PROC (MISCELLANEOUS) ×2 IMPLANT
POUCH LAPAROSCOPIC INSTRUMENT (MISCELLANEOUS) ×3 IMPLANT
PROTECTOR NERVE ULNAR (MISCELLANEOUS) ×6 IMPLANT
SCOPETTES 8  STERILE (MISCELLANEOUS) ×3
SCOPETTES 8 STERILE (MISCELLANEOUS) ×2 IMPLANT
SET IRRIG Y TYPE TUR BLADDER L (SET/KITS/TRAYS/PACK) ×3 IMPLANT
SET SUCTION IRRIG HYDROSURG (IRRIGATION / IRRIGATOR) ×3 IMPLANT
SET TRI-LUMEN FLTR TB AIRSEAL (TUBING) ×3 IMPLANT
SHEARS HARMONIC ACE PLUS 36CM (ENDOMECHANICALS) IMPLANT
SUT ENDO VLOC 180-0-8IN (SUTURE) IMPLANT
SUT MNCRL AB 4-0 PS2 18 (SUTURE) ×6 IMPLANT
SUT VIC AB 0 CT1 27 (SUTURE) ×6
SUT VIC AB 0 CT1 27XBRD ANBCTR (SUTURE) ×4 IMPLANT
SUT VICRYL 0 UR6 27IN ABS (SUTURE) ×3 IMPLANT
SUT VLOC 180 0 9IN  GS21 (SUTURE) ×3
SUT VLOC 180 0 9IN GS21 (SUTURE) ×2 IMPLANT
SYR 10ML LL (SYRINGE) ×3 IMPLANT
SYR 50ML LL SCALE MARK (SYRINGE) ×3 IMPLANT
SYSTEM CARTER THOMASON II (TROCAR) ×3 IMPLANT
TOWEL OR 17X26 10 PK STRL BLUE (TOWEL DISPOSABLE) ×3 IMPLANT
TRAY FOLEY W/BAG SLVR 14FR LF (SET/KITS/TRAYS/PACK) ×3 IMPLANT
TRENDGUARD 600 HYBRID PROC PK (MISCELLANEOUS) ×3
TROCAR BLADELESS OPT 5 100 (ENDOMECHANICALS) ×6 IMPLANT
TROCAR PORT AIRSEAL 8X120 (TROCAR) ×3 IMPLANT
TROCAR XCEL NON-BLD 11X100MML (ENDOMECHANICALS) ×3 IMPLANT
WARMER LAPAROSCOPE (MISCELLANEOUS) ×3 IMPLANT

## 2020-10-05 NOTE — Transfer of Care (Signed)
Immediate Anesthesia Transfer of Care Note  Patient: Kristin Harper  Procedure(s) Performed: TOTAL LAPAROSCOPIC HYSTERECTOMY WITH SALPINGECTOMY (Bilateral: Abdomen) CYSTOSCOPY (Urethra)  Patient Location: PACU  Anesthesia Type:General  Level of Consciousness: awake, alert , oriented and patient cooperative  Airway & Oxygen Therapy: Patient Spontanous Breathing and Patient connected to face mask oxygen  Post-op Assessment: Report given to RN and Post -op Vital signs reviewed and stable  Post vital signs: Reviewed and stable  Last Vitals:  Vitals Value Taken Time  BP 146/101 10/05/20 1628  Temp    Pulse 91 10/05/20 1628  Resp 26 10/05/20 1629  SpO2 100 % 10/05/20 1628  Vitals shown include unvalidated device data.  Last Pain:  Vitals:   10/05/20 1127  TempSrc:   PainSc: 0-No pain      Patients Stated Pain Goal: 5 ([id number])  Complications: No notable events documented.

## 2020-10-05 NOTE — Anesthesia Procedure Notes (Signed)
Procedure Name: Intubation Date/Time: 10/05/2020 1:08 PM Performed by: Rogers Blocker, CRNA Pre-anesthesia Checklist: Patient identified, Emergency Drugs available, Suction available and Patient being monitored Patient Re-evaluated:Patient Re-evaluated prior to induction Oxygen Delivery Method: Circle System Utilized Preoxygenation: Pre-oxygenation with 100% oxygen Induction Type: IV induction Ventilation: Mask ventilation without difficulty Laryngoscope Size: Mac and 3 Grade View: Grade I Tube type: Oral Tube size: 7.0 mm Number of attempts: 1 Airway Equipment and Method: Stylet Placement Confirmation: ETT inserted through vocal cords under direct vision, positive ETCO2 and breath sounds checked- equal and bilateral Secured at: 22 cm Tube secured with: Tape Dental Injury: Teeth and Oropharynx as per pre-operative assessment

## 2020-10-05 NOTE — Progress Notes (Signed)
Patient stood and placed herself in wheelchair.  She was taken to [facility] room 2.  Patient tolerated this transfer well, standing and sitting.

## 2020-10-05 NOTE — Interval H&P Note (Signed)
History and Physical Interval Note:  10/05/2020 12:57 PM  Kristin Harper  has presented today for surgery, with the diagnosis of D75.9 UTERINE FIBROIDS.  The various methods of treatment have been discussed with the patient and family. After consideration of risks, benefits and other options for treatment, the patient has consented to  Procedure(s): TOTAL LAPAROSCOPIC HYSTERECTOMY WITH SALPINGECTOMY (Bilateral)  AND CYSTOSCOPY as a surgical intervention.  The patient's history has been reviewed, patient examined, no change in status, stable for surgery.  I have reviewed the patient's chart and labs.  Questions were answered to the patient's satisfaction.    Archie Endo, MD.

## 2020-10-05 NOTE — Anesthesia Postprocedure Evaluation (Signed)
Anesthesia Post Note  Patient: Kristin Harper  Procedure(s) Performed: TOTAL LAPAROSCOPIC HYSTERECTOMY WITH SALPINGECTOMY (Bilateral: Abdomen) CYSTOSCOPY (Urethra)     Patient location during evaluation: PACU Anesthesia Type: General Level of consciousness: awake and alert and oriented Pain management: pain level controlled Vital Signs Assessment: post-procedure vital signs reviewed and stable Respiratory status: spontaneous breathing, nonlabored ventilation and respiratory function stable Cardiovascular status: blood pressure returned to baseline and stable Postop Assessment: no apparent nausea or vomiting Anesthetic complications: no   No notable events documented.  Last Vitals:  Vitals:   10/05/20 1715 10/05/20 1730  BP: (!) 146/90 138/88  Pulse: 92 91  Resp: 20 (!) 22  Temp:  (!) 36.2 C  SpO2: 97% 99%    Last Pain:  Vitals:   10/05/20 1730  TempSrc:   PainSc: 3                  [name] A.

## 2020-10-05 NOTE — Op Note (Deleted)
Kristin Harper MRN: [id number] DOB: 1975-06-24 Date of procedure: 10/05/2020.   Pre-op diagnosis:  1. 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   2. Minimal uterine descensus.  3. Dysmenorrhea. 4. Menorrhagia. 5. Failed conservative management of medication (tranexamic acid and ibuprofen), dilation and curretage hysteroscopy and now desiring definitive therapy via hysterectomy.    6. Desire to decrease future risk of ovarian and fallopian tube cancer.    7. BMI 44.  8. Anemia with a pre-op hemoglobin of 10.3.    Post-op diagnosis: Same as above.     Surgeries:  1. Total Laparoscopic Hysterectomy and Bilateral salpingectomy. 2. Cystoscopy.    Surgeon: Dr. Waymon Amato.    Physician as an Environmental consultant: Dr. Sanjuana Kava.  Attending attestation: I was present and scrubbed and performed the procedure and the Physician as an assistant was required due to the complexity of anatomy.      Anesthesia: General Endotracheal: Dr. Daiva Huge, Leanord Hawking.   EBL: 75 cc  IV Fluids:  1300 cc  Urine output: 100 cc clear urine   Complications: None.   Findings:  1.12 week sized uterus  (217 grams total weight of specimen) with at least four subserosal and intramural fibroids with minimal descensus. 2. Normal right and left fallopian tubes, normal left and right ovaries. 3. Normal bladder with strong bilateral ureteral jets seen during cystoscopy, urine stained with indigo carmine.    4. Normal appendix, normal appearing liver edge and bowel.    Indications: 45 y/o female with a long history of symptomatic fibroids who failed conservative medical treatment and conservative surgery desired definitive treatment and therefore presented for a hysterectomy.  Patient also desired opportunistic bilateral salpingectomy to decrease her future risk of fallopian tube and ovarian cancer.     Procedure: Informed consent was obtained from the patient. She was taken to the operating room where anesthesia was  administered.  She was carefully positioned in the operating room table in dorsal lithotomy position with both arms tucked to her sides.  She was prepped and draped in the usual sterile fashion.  A foley catheter was  placed in the bladder. Speculum was placed in the vagina.  Tenaculum placed at cervix and cervix dilated to # 23 Pratt dilator.  A stitch placed on the cervix  at 12 o'clock position using 0 vicryl.  Cervical size was found to be 4 cm. The number 4 Advincula uterine manipulator was placed and uterine bulb instilled with 7 cc of NS. The cervical suture was passed around the the cervical cup and tied.   Attention was then turned to the abdomen. 0.25% marcaine was injected in umbilical area, infraumbilical skin was incised and 11 mm excel trocar placed under direct visualization, with only subcutaneous fat, linea alba and peritoneum layers encountered.  Abdomen was insufflated with gas and correct placement verified visually.  She was placed in T-burg position and maximum gas pressure set was 15 mm Hg.  Two other side ports were placed on the left side of the abdomen under direct visualization, one 87m at level of umbulicus about [id number] and a 843mAirseal trocar at the lower level 2 fingerbreadths medial and superior to anterior superior iliac spine. One 24m21mort was placed on the opposite lower right side under direct visualization.  The bowel was moved out of the way with an atraumatic probe and the abdomen was surveyed with above findings.  The ureters were identified at the pelvic brim bilaterally.  The left fallopian tube mesosalpinx,  uteroovarian ligament and round ligament were cauterized and transected with the 24m ligasure, Ligasure L Hook device used for rest of this procedure unless stated otherwise.  Active uterine manipulation was provided by the surgical tech during the entire procedure as needed for exposure of the surgical field.  Advancement was made from the round ligament onto  the mesometrium until the uterine artery level near the K[place]burgcup was reached.  The bladder flap was then created.  The posterior peritoneum was taken down then the left uterine artery was cauterized at the level of the Koh cup and above it and then transected.  Attention was then turned to the the right side where the right fallopian tube mesosalpinx was cauterized and transected, then the utero-ovarian, round and mesometrium were taken down similarly.  The bladder flap was developed on the right side.  The posterior peritoneum was similarly dissected.    The right uterine artery which was prominent and tortuous was similarly cauterized and transected at the level of the Koh cup.  The Koh cup was palpated with the ligasure and then uterus transected at that level with the L- Hook of the ligasure circumferentially while pushing upwards on the manipulator.  The uterus with cervix was then delivered through the vagina with the manipulator.  Small bleeders on the cuff were contained with ligasure.  The vaginal cuff was then closed with a running stitch of 0 V-lock 9 inch laparoscopically, ensuring to incorporate the bilateral uterosacral ligaments in the cuff closure.  Irrigation was applied and suctioned out.  Arista was placed on the vaginal cuff to enhance hemostasis.  5 manual inflations were given by anesthesia and gas allowed to escape.  Patient was taken out of Trendelenburg after removal of all laparocopic instruments under visualization.  The umbilical incision was closed with 0-vicryl on the fascia and 4-0 monocryl used to close all the skin incisions in a subcuticular stitch.  Dermabond was applied and Incisions covered.  Cystoscopy was performed with the 70 degree scope and above findings noted.  The scope was removed and foley catheter was replaced.  The patient was cleaned and awoken from anesthesia and taken to the recovery room in stable condition.  Final count was correct.     Specimen:  Uterus with  cervix.  Right and left fallopian tubes.    Disposition: TO PACU in stable condition.  EWaymon Amato MD. 10/05/2020 1721.

## 2020-10-05 NOTE — Discharge Instructions (Addendum)
Verlin,  1. After showering please ensure that the incisions are patted dry with a towel. 2.Do not have any intercourse or douching or use tampons for the next six weeks.  3.  Expect incisional soreness and some upper back pain which could be from the gas that was used during the procedure.  Being able to walk around every day will help in absorption of the gas and resolution of the back pain.   4. You may take over the counter stool softener such as colace if with constipation.  5. You may use over counter tylenol and prescribed ibuprofen for pain as needed.  If it is not improving you may use prescribed oxycodone.  Let me know if despite oxycodone use you experience unmanageable pain.   6.Please call me if with other symptoms such as nausea, vomiting, fevers or chills or anything different from your baseline, any problems or questions.  7.  Office will call you to schedule your 2 and 6 week appointments, but do reach out to [place] if nobody contacts you by next Wednesday.      Dr. Waymon Amato.  Phone: [phone]  Extension [phone] if regular business hours. If after hours please choose option to speak to provider on call.

## 2020-10-05 NOTE — Progress Notes (Signed)
Received call from MD, Dr. Alesia Richards, who was checking on patient. Notified her of patient's progress and of patient's low urine output. MD gave verbal orders to give a 500cc Lactated ringers bolus then to increase IVF rate to 150cc/hr. She also requested that we notify her if urine output does not pick up overnight. Will continue to closely monitor patient.

## 2020-10-05 NOTE — Op Note (Addendum)
Kristin Harper MRN: [id number] DOB: Jan 08, 1976 Date of procedure: 10/05/2020.   Pre-op diagnosis:  1. 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   2. Minimal uterine descensus.  3. Dysmenorrhea. 4. Menorrhagia. 5. Failed conservative management of medication (tranexamic acid and ibuprofen), dilation and curretage hysteroscopy and now desiring definitive therapy via hysterectomy.    6. Desire to decrease future risk of ovarian and fallopian tube cancer.    7. BMI 44.  8. Anemia with a pre-op hemoglobin of 10.3.    Post-op diagnosis: Same as above.     Surgeries:  1. Total Laparoscopic Hysterectomy and Bilateral salpingectomy. 2. Cystoscopy.    Surgeon: Dr. Waymon Amato.    Assistant: Dr. Sanjuana Kava.  Attending attestation: I was present and scrubbed and performed the procedure and the Physician as an assistant was required due to the complexity of anatomy.      Anesthesia: General Endotracheal: Dr. Daiva Huge, Leanord Hawking.   EBL: 75 cc  IV Fluids:  1300 cc  Urine output: 100 cc clear urine   Complications: None.   Findings:  1.12 week sized uterus  (217 grams total weight of specimen) with at least four subserosal and intramural fibroids with minimal descensus. 2. Normal right and left fallopian tubes, normal left and right ovaries. 3. Normal bladder with strong bilateral ureteral jets seen during cystoscopy, urine stained with indigo carmine.    4. Normal appendix, normal appearing liver edge and bowel.    Indications: 45 y/o female with a long history of symptomatic fibroids who failed conservative medical treatment and conservative surgery desired definitive treatment and therefore presented for a hysterectomy.  Patient also desired opportunistic bilateral salpingectomy to decrease her future risk of fallopian tube and ovarian cancer.     Procedure: Informed consent was obtained from the patient. She was taken to the operating room where anesthesia was administered.  She was  carefully positioned in the operating room table in dorsal lithotomy position with both arms tucked to her sides.  She was prepped and draped in the usual sterile fashion.  Gentamicin and clindamycin were given preoperatively.  A foley catheter was  placed in the bladder. Speculum was placed in the vagina.  Tenaculum placed at cervix and cervix dilated to # 23 Pratt dilator.  A stitch placed on the cervix  at 12 o'clock position using 0 vicryl.  Cervical size was found to be 4 cm. The number 4 Advincula uterine manipulator was placed and uterine bulb instilled with 7 cc of NS. The cervical suture was passed around the the cervical cup and tied.   Attention was then turned to the abdomen. 0.25% marcaine was injected in umbilical area, infraumbilical skin was incised and 11 mm excel trocar placed under direct visualization, with only subcutaneous fat, linea alba and peritoneum layers encountered.  Abdomen was insufflated with gas and correct placement verified visually.  She was placed in T-burg position and maximum gas pressure set was 15 mm Hg.  Two other side ports were placed on the left side of the abdomen under direct visualization, one 53m at level of umbulicus about [id number] and a 850mAirseal trocar at the lower level 2 fingerbreadths medial and superior to anterior superior iliac spine. One 35m635mort was placed on the opposite lower right side under direct visualization.  The bowel was moved out of the way with an atraumatic probe and the abdomen was surveyed with above findings.  The ureters were identified at the pelvic brim bilaterally.  The  left fallopian tube mesosalpinx, uteroovarian ligament and round ligament were cauterized and transected with the 60m ligasure, Ligasure L Hook device used for rest of this procedure unless stated otherwise.  Active uterine manipulation was provided by the surgical tech during the entire procedure as needed for exposure of the surgical field.  Advancement was  made from the round ligament onto the mesometrium until the uterine artery level near the KTokenekecup was reached.  The bladder flap was then created and the bladder dissected away from the cervix to the level of the pubovesical cervical fascia.  The posterior peritoneum was taken down then the left uterine artery was cauterized at the level of the Koh cup and above it and then transected.  Attention was then turned to the the right side where the right fallopian tube mesosalpinx was cauterized and transected, then the utero-ovarian, round and mesometrium were taken down similarly.  The bladder flap was developed on the right side.  The posterior peritoneum was similarly dissected.    The right uterine artery which was prominent and tortuous was similarly cauterized and transected at the level of the Koh cup.  The Koh cup was palpated with the ligasure and then uterus transected at that level with the L- Hook of the ligasure circumferentially while pushing upwards on the manipulator.  The uterus with cervix was then delivered through the vagina with the manipulator.  Small bleeders on the cuff were contained with ligasure.  The vaginal cuff was then closed with a running stitch of 0 V-lock 9 inch laparoscopically, ensuring to incorporate the bilateral uterosacral ligaments in the cuff closure.  Irrigation was applied and suctioned out.  Arista was placed on the vaginal cuff to enhance hemostasis.  5 manual inflations were given by anesthesia and gas allowed to escape.  Patient was taken out of Trendelenburg after removal of all laparocopic instruments under visualization.  The umbilical incision was closed with 0-vicryl on the fascia and 4-0 monocryl used to close all the skin incisions in a subcuticular stitch and dermabond was applied.  Cystoscopy was performed with the 70 degree scope and above findings noted.  The scope was removed and foley catheter was replaced.  The patient was cleaned and awoken from anesthesia  and taken to the recovery room in stable condition.  Final count was correct.     Specimen:  Uterus with cervix.  Right and left fallopian tubes.    Disposition: TO PACU in stable condition.  EWaymon Amato MD. 10/05/2020 1721.

## 2020-10-05 NOTE — Anesthesia Preprocedure Evaluation (Addendum)
Anesthesia Evaluation  Patient identified by MRN, date of birth, ID band Patient awake    Reviewed: Allergy & Precautions, NPO status , Patient's Chart, lab work & pertinent test results  History of Anesthesia Complications Negative for: history of anesthetic complications  Airway Mallampati: II  TM Distance: >3 FB Neck ROM: Full    Dental  (+) Missing,    Pulmonary neg pulmonary ROS,    Pulmonary exam normal        Cardiovascular negative cardio ROS Normal cardiovascular exam     Neuro/Psych Pseudotumor cerebri    GI/Hepatic Neg liver ROS, GERD  Controlled,  Endo/Other  Morbid obesity (BMI 45)  Renal/GU negative Renal ROS  negative genitourinary   Musculoskeletal negative musculoskeletal ROS (+)   Abdominal   Peds  Hematology  (+) anemia , Hgb 10.3   Anesthesia Other Findings Day of surgery medications reviewed with patient.  Reproductive/Obstetrics Uterine fibroids                            Anesthesia Physical Anesthesia Plan  ASA: 3  Anesthesia Plan: General   Post-op Pain Management:    Induction: Intravenous  PONV Risk Score and Plan: 4 or greater and Treatment may vary due to age or medical condition, Ondansetron, Dexamethasone, Midazolam and Scopolamine patch - Pre-op  Airway Management Planned: Oral ETT  Additional Equipment: None  Intra-op Plan:   Post-operative Plan: Extubation in OR  Informed Consent: I have reviewed the patients History and Physical, chart, labs and discussed the procedure including the risks, benefits and alternatives for the proposed anesthesia with the patient or authorized representative who has indicated his/her understanding and acceptance.     Dental advisory given  Plan Discussed with: CRNA  Anesthesia Plan Comments:        Anesthesia Quick Evaluation

## 2020-10-06 ENCOUNTER — Encounter (HOSPITAL_BASED_OUTPATIENT_CLINIC_OR_DEPARTMENT_OTHER): Payer: Self-pay | Admitting: Obstetrics & Gynecology

## 2020-10-06 DIAGNOSIS — D259 Leiomyoma of uterus, unspecified: Secondary | ICD-10-CM | POA: Diagnosis not present

## 2020-10-06 LAB — CBC
HCT: 33.5 % — ABNORMAL LOW (ref 36.0–46.0)
Hemoglobin: 9.3 g/dL — ABNORMAL LOW (ref 12.0–15.0)
MCH: 19.1 pg — ABNORMAL LOW (ref 26.0–34.0)
MCHC: 27.8 g/dL — ABNORMAL LOW (ref 30.0–36.0)
MCV: 68.9 fL — ABNORMAL LOW (ref 80.0–100.0)
Platelets: 304 10*3/uL (ref 150–400)
RBC: 4.86 MIL/uL (ref 3.87–5.11)
RDW: 17 % — ABNORMAL HIGH (ref 11.5–15.5)
WBC: 10.9 10*3/uL — ABNORMAL HIGH (ref 4.0–10.5)
nRBC: 0 % (ref 0.0–0.2)

## 2020-10-06 LAB — BASIC METABOLIC PANEL
Anion gap: 7 (ref 5–15)
BUN: 7 mg/dL (ref 6–20)
CO2: 21 mmol/L — ABNORMAL LOW (ref 22–32)
Calcium: 9 mg/dL (ref 8.9–10.3)
Chloride: 112 mmol/L — ABNORMAL HIGH (ref 98–111)
Creatinine, Ser: 0.66 mg/dL (ref 0.44–1.00)
GFR, Estimated: >60 mL/min (ref >60)
Glucose, Bld: 124 mg/dL — ABNORMAL HIGH (ref 70–99)
Potassium: 4.5 mmol/L (ref 3.5–5.1)
Sodium: 140 mmol/L (ref 135–145)

## 2020-10-06 MED ORDER — IBUPROFEN 800 MG PO TABS: 800.0000 mg | ORAL_TABLET | Freq: Three times a day (TID) | ORAL | 0 refills | Status: DC

## 2020-10-06 MED ORDER — OXYCODONE HCL 5 MG PO TABS
ORAL_TABLET | ORAL | Status: AC
  Filled 2020-10-06: qty 1

## 2020-10-06 MED ORDER — ACETAMINOPHEN 500 MG PO TABS: 1000.0000 mg | ORAL_TABLET | Freq: Four times a day (QID) | ORAL | 0 refills | Status: DC

## 2020-10-06 MED ORDER — ACETAMINOPHEN 500 MG PO TABS
ORAL_TABLET | ORAL | Status: AC
  Filled 2020-10-06: qty 2

## 2020-10-06 MED ORDER — MENTHOL 3 MG MT LOZG: 1.0000 | LOZENGE | OROMUCOSAL | 12 refills | Status: DC | PRN

## 2020-10-06 MED ORDER — IBUPROFEN 800 MG PO TABS
ORAL_TABLET | ORAL | Status: AC
  Filled 2020-10-06: qty 1

## 2020-10-06 MED ORDER — OXYCODONE HCL 5 MG PO TABS: 5.0000 mg | ORAL_TABLET | ORAL | 0 refills | Status: DC | PRN

## 2020-10-06 NOTE — Progress Notes (Addendum)
1 Day Post-Op Procedure(s) (LRB): TOTAL LAPAROSCOPIC HYSTERECTOMY WITH SALPINGECTOMY (Bilateral) CYSTOSCOPY  Subjective: Patient reports incisional pain, tolerating PO, and no problems voiding.    Objective: I have reviewed patient's vital signs, intake and output, medications, and labs. Vitals with BMI 10/06/2020 10/06/2020 10/06/2020  Height - - -  Weight - - -  BMI - - -  Systolic [id number]  Diastolic 93 78 77  Pulse A999333 102 104    Reviewed with RN Dawn:  Input:  1315 cc LR/7 hrs  Urine Output voided : 600cc/4 hrs   General: alert, cooperative, and no distress Resp: clear to auscultation bilaterally Cardio: regular rate and rhythm, S1, S2 normal, no murmur, click, rub or gallop GI: soft, non-tender; bowel sounds normal; no masses,  no organomegaly Extremities: extremities normal, atraumatic, no cyanosis or edema Vaginal Bleeding: minimal Incisions: Clean, dry and intact with dermabond.   CBC Latest Ref Rng & Units 10/06/2020 10/02/2020 07/28/2015  WBC 4.0 - 10.5 K/uL 10.9(H) 6.1 7.1  Hemoglobin 12.0 - 15.0 g/dL 9.3(L) 10.3(L) 12.3  Hematocrit 36.0 - 46.0 % 33.5(L) 36.6 40.5  Platelets 150 - 400 K/uL 304 356 306    CMP Latest Ref Rng & Units 10/06/2020 10/02/2020 07/28/2015  Glucose 70 - 99 mg/dL 124(H) 101(H) 84  BUN 6 - 20 mg/dL '7 8 7  '$ Creatinine 0.44 - 1.00 mg/dL 0.66 0.64 0.86  Sodium 135 - 145 mmol/L 140 137 138  Potassium 3.5 - 5.1 mmol/L 4.5 3.7 4.0  Chloride 98 - 111 mmol/L 112(H) 110 110  CO2 22 - 32 mmol/L 21(L) 21(L) 23  Calcium 8.9 - 10.3 mg/dL 9.0 9.2 9.4  Total Protein 6.5 - 8.1 g/dL - - 7.0  Total Bilirubin 0.3 - 1.2 mg/dL - - 0.6  Alkaline Phos 38 - 126 U/L - - 48  AST 15 - 41 U/L - - 15  ALT 14 - 54 U/L - - 14    Assessment: s/p Procedure(s): TOTAL LAPAROSCOPIC HYSTERECTOMY WITH SALPINGECTOMY (Bilateral) CYSTOSCOPY: stable  Plan: Discussed intra-op findings, procedure and post op care and precautions. All her questions were answered and she expressed  understanding.  Encourage ambulation Discontinue IV fluids Discharge home  LOS: 0 days  Tavie Haseman W Raymund Manrique,MD.  10/06/2020, 8:31 AM

## 2020-10-06 NOTE — Discharge Summary (Addendum)
Physician Discharge Summary  Patient ID: Kristin Harper MRN: [id number] DOB/AGE: 44-Jun-1977 44 y.o.  Admit date: 10/05/2020 Discharge date: 10/06/2020  Admission Diagnoses: 1. 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   2. Minimal uterine descensus.  3. Dysmenorrhea. 4. Menorrhagia. 5. Failed conservative management of medication (tranexamic acid and ibuprofen), dilation and curretage hysteroscopy and now desiring definitive therapy via hysterectomy.    6. Desire to decrease future risk of ovarian and fallopian tube cancer.    7. BMI 44.  8. Anemia with a pre-op hemoglobin of 10.3. 9. Benign intracranial hypertension.    Discharge Diagnoses:  Same as above.   Discharged Condition: stable  Hospital Course: Patient underwent a total laparoscopic hysterectomy and bilateral salpingectomy and cystoscopy, please see operative note for more details.  Her EBL was 75cc. Post operatively she did well,was tolerating a regular diet, ambulating and voiding without difficulty. Her pelvic and abdominal pain were well controlled.  Her laboratory studies were stable. She was deemed stable for discharge to home.   Consults: None  Significant Diagnostic Studies: labs:  CBC Latest Ref Rng & Units 10/06/2020 10/02/2020 07/28/2015  WBC 4.0 - 10.5 K/uL 10.9(H) 6.1 7.1  Hemoglobin 12.0 - 15.0 g/dL 9.3(L) 10.3(L) 12.3  Hematocrit 36.0 - 46.0 % 33.5(L) 36.6 40.5  Platelets 150 - 400 K/uL 304 356 306    CMP Latest Ref Rng & Units 10/06/2020 10/02/2020 07/28/2015  Glucose 70 - 99 mg/dL 124(H) 101(H) 84  BUN 6 - 20 mg/dL '7 8 7  '$ Creatinine 0.44 - 1.00 mg/dL 0.66 0.64 0.86  Sodium 135 - 145 mmol/L 140 137 138  Potassium 3.5 - 5.1 mmol/L 4.5 3.7 4.0  Chloride 98 - 111 mmol/L 112(H) 110 110  CO2 22 - 32 mmol/L 21(L) 21(L) 23  Calcium 8.9 - 10.3 mg/dL 9.0 9.2 9.4  Total Protein 6.5 - 8.1 g/dL - - 7.0  Total Bilirubin 0.3 - 1.2 mg/dL - - 0.6  Alkaline Phos 38 - 126 U/L - - 48  AST 15 - 41 U/L - - 15  ALT 14 -  54 U/L - - 14    Treatments: surgery: Total laparoscopic hysterectomy and bilateral salpingectomy and cystoscopy.   Discharge Exam: Blood pressure (!) 142/78, pulse (!) 102, temperature 98.6 F (37 C), resp. rate 20, height 5' 6.5" (1.689 m), weight 127.4 kg, last menstrual period 09/10/2020, SpO2 98 %. Vitals:   10/05/20 2130 10/06/20 0215 10/06/20 0629 10/06/20 0905  BP: (!) 149/98 134/77 (!) 142/78 (!) 139/93  Pulse: (!) 104 (!) 104 (!) 102 (!) 110  Resp: '20 20 20 20  '$ Temp: 98.8 F (37.1 C) 98.2 F (36.8 C) 98.6 F (37 C) 98.6 F (37 C)  TempSrc:      SpO2: 98% 98% 98% 98%  Weight:      Height:        General: alert, cooperative, and no distress Resp: clear to auscultation bilaterally Cardio: regular rate and rhythm, S1, S2 normal, no murmur, click, rub or gallop GI: soft, non-tender; bowel sounds normal; no masses,  no organomegaly Extremities: extremities normal, atraumatic, no cyanosis or edema Vaginal Bleeding: minimal Incisions: 4 laparoscopy incisions clean, dry, intact, with dermabond.   Disposition: Stable, To Home.    Allergies as of 10/06/2020       Reactions   Penicillins Hives, Rash        Medication List     TAKE these medications    acetaminophen 500 MG tablet Commonly known as: TYLENOL Take 2  tablets (1,000 mg total) by mouth every 6 (six) hours.   acetaZOLAMIDE ER 500 MG capsule Commonly known as: DIAMOX Take 500 mg by mouth daily. Is diamox er per pt   ibuprofen 800 MG tablet Commonly known as: ADVIL Take 1 tablet (800 mg total) by mouth every 8 (eight) hours. What changed:  medication strength how much to take when to take this reasons to take this   menthol-cetylpyridinium 3 MG lozenge Commonly known as: CEPACOL Take 1 lozenge (3 mg total) by mouth every 2 (two) hours as needed for sore throat.   OVER THE COUNTER MEDICATION Vita fusion iron 1 tab daily   oxyCODONE 5 MG immediate release tablet Commonly known as: Oxy  IR/ROXICODONE Take 1-2 tablets (5-10 mg total) by mouth every 4 (four) hours as needed for moderate pain, severe pain or breakthrough pain.        Follow-up Information     Waymon Amato, MD. Go in 2 week(s).   Specialty: Obstetrics and Gynecology Why: For 2 week post op check.  My scheduler will call you to schedule the appointment. Contact information: [address] [place] [address] [phone]                Signed: Archie Endo, MD  10/06/2020, 8:31 AM

## 2020-10-09 LAB — SURGICAL PATHOLOGY

## 2021-07-18 ENCOUNTER — Emergency Department (HOSPITAL_COMMUNITY): Payer: Commercial Managed Care - HMO

## 2021-07-18 ENCOUNTER — Other Ambulatory Visit: Payer: Self-pay

## 2021-07-18 ENCOUNTER — Emergency Department (HOSPITAL_COMMUNITY)
Admission: EM | Admit: 2021-07-18 | Discharge: 2021-07-19 | Disposition: A | Discharge status: Home / Self Care | Payer: Commercial Managed Care - HMO | Attending: Student | Admitting: Student

## 2021-07-18 DIAGNOSIS — J9801 Acute bronchospasm: Secondary | ICD-10-CM | POA: Diagnosis not present

## 2021-07-18 DIAGNOSIS — R0602 Shortness of breath: Secondary | ICD-10-CM | POA: Diagnosis present

## 2021-07-18 LAB — BASIC METABOLIC PANEL
Anion gap: 8 (ref 5–15)
BUN: 8 mg/dL (ref 6–20)
CO2: 22 mmol/L (ref 22–32)
Calcium: 9.7 mg/dL (ref 8.9–10.3)
Chloride: 108 mmol/L (ref 98–111)
Creatinine, Ser: 1.16 mg/dL — ABNORMAL HIGH (ref 0.44–1.00)
GFR, Estimated: 60 mL/min — ABNORMAL LOW (ref >60)
Glucose, Bld: 94 mg/dL (ref 70–99)
Potassium: 3.6 mmol/L (ref 3.5–5.1)
Sodium: 138 mmol/L (ref 135–145)

## 2021-07-18 LAB — CBC
HCT: 46.6 % — ABNORMAL HIGH (ref 36.0–46.0)
Hemoglobin: 13.6 g/dL (ref 12.0–15.0)
MCH: 20.7 pg — ABNORMAL LOW (ref 26.0–34.0)
MCHC: 29.2 g/dL — ABNORMAL LOW (ref 30.0–36.0)
MCV: 70.8 fL — ABNORMAL LOW (ref 80.0–100.0)
Platelets: 332 10*3/uL (ref 150–400)
RBC: 6.58 MIL/uL — ABNORMAL HIGH (ref 3.87–5.11)
RDW: 18 % — ABNORMAL HIGH (ref 11.5–15.5)
WBC: 9 10*3/uL (ref 4.0–10.5)
nRBC: 0 % (ref 0.0–0.2)

## 2021-07-18 LAB — TROPONIN I (HIGH SENSITIVITY): Troponin I (High Sensitivity): 4 ng/L (ref <18)

## 2021-07-18 LAB — I-STAT BETA HCG BLOOD, ED (MC, WL, AP ONLY): I-stat hCG, quantitative: <5 m[IU]/mL (ref <5)

## 2021-07-18 NOTE — ED Triage Notes (Signed)
Pt here for central chest tightness, shob and mouth pain. Pt was mixing bleach and developer on Thursday when doing someones hair and believes the fumes were trapped in her house, as she wasn't able to open windows and it had rained. Pt states she feels like her mouth is burnt

## 2021-07-18 NOTE — ED Provider Triage Note (Signed)
Emergency Medicine Provider Triage Evaluation Note  Shaunessy Dobratz , a 46 y.o. female  was evaluated in triage.  Pt complains of shortness of breath, chest pain, mouth pain secondary to possible chemical exposure.  Patient states that she was mixing chemicals to use on hair last Thursday.  She is scared she may have created some sort of toxic fumes.  She states that she is having some chest pain and is worried about her lungs today.  Patient does not appear to be short of breath at this time but states that she has had some shortness of breath.  Tightness/pain described as central in nature, no radiation of symptoms.  Patient does state that she feels like her mouth may be burned  Review of Systems  Positive: Chest pain, shortness of breath, mouth pain Negative: Abdominal pain, nausea, vomiting  Physical Exam  BP (!) 128/100 (BP Location: Right Arm)   Pulse (!) 109   Temp 98.7 F (37.1 C) (Oral)   Resp 18   SpO2 98%  Gen:   Awake, no distress   Resp:  Normal effort  MSK:   Moves extremities without difficulty  Other:    Medical Decision Making  Medically screening exam initiated at 9:51 PM.  Appropriate orders placed.  Eliany Mccarter was informed that the remainder of the evaluation will be completed by another provider, this initial triage assessment does not replace that evaluation, and the importance of remaining in the ED until their evaluation is complete.     Dorothyann Peng, PA-C 07/18/21 2152

## 2021-07-19 ENCOUNTER — Encounter (HOSPITAL_COMMUNITY): Payer: Self-pay | Admitting: Emergency Medicine

## 2021-07-19 LAB — TROPONIN I (HIGH SENSITIVITY): Troponin I (High Sensitivity): 4 ng/L (ref <18)

## 2021-07-19 MED ORDER — ALBUTEROL SULFATE HFA 108 (90 BASE) MCG/ACT IN AERS
2.0000 | INHALATION_SPRAY | Freq: Once | RESPIRATORY_TRACT | Status: AC
  Administered 2021-07-19: 2 via RESPIRATORY_TRACT
  Filled 2021-07-19: qty 6.7

## 2021-07-19 NOTE — ED Provider Notes (Signed)
[facility] EMERGENCY DEPARTMENT Provider Note   CSN: [id number] Arrival date & time: 07/18/21  2107     History  Chief Complaint  Patient presents with   Shortness of Breath   mouth pain    Kristin Harper is a 46 y.o. female.  The history is provided by the patient and medical records.  Shortness of Breath  46 y.o. female presenting to the ED after chemical exposure.  Reports last Thursday (7 days ago) she was mixing hair dye at home-- specifically bleach with developer.  States afterwards odor in the home was very pungent, could even taste it in her mouth.  Couldn't open windows in her apartment and fumes were causing her eyes to water and throat to burn so she decided to leave.  She has not been staying in her apartment since then, rather has been opening the windows and sliding door during the day to get this to air out.  When entering the home she has since been wearing a respirator and has been avoiding directly breathing the fumes.  States she continues to have some intermittent shortness of breath and tightness in the center of her chest.  She denies any cough, hemoptysis.  Also states she feels like she may have a "chemical burn" in her mouth.  States after brushing her teeth yesterday she spit out a little bit of blood.  She denies any dental pain or other oral lesions.  She has no history of asthma or other baseline respiratory issues.  Home Medications Prior to Admission medications   Medication Sig Start Date End Date Taking? Authorizing Provider  acetaminophen (TYLENOL) 500 MG tablet Take 2 tablets (1,000 mg total) by mouth every 6 (six) hours. 10/06/20   Waymon Amato, MD  acetaZOLAMIDE (DIAMOX) 500 MG capsule Take 500 mg by mouth daily. Is diamox er per pt 12/17/13   [provider]  ibuprofen (ADVIL) 800 MG tablet Take 1 tablet (800 mg total) by mouth every 8 (eight) hours. 10/06/20   Waymon Amato, MD  menthol-cetylpyridinium (CEPACOL) 3 MG lozenge  Take 1 lozenge (3 mg total) by mouth every 2 (two) hours as needed for sore throat. 10/06/20   Waymon Amato, MD  OVER THE COUNTER MEDICATION Vita fusion iron 1 tab daily    [provider]  oxyCODONE (OXY IR/ROXICODONE) 5 MG immediate release tablet Take 1-2 tablets (5-10 mg total) by mouth every 4 (four) hours as needed for moderate pain, severe pain or breakthrough pain. 10/06/20   Waymon Amato, MD      Allergies    Penicillins    Review of Systems   Review of Systems  Respiratory:  Positive for shortness of breath.   All other systems reviewed and are negative.  Physical Exam Updated Vital Signs BP (!) 143/86 (BP Location: Left Arm)   Pulse 80   Temp 98.7 F (37.1 C) (Oral)   Resp 16   Ht 5' 6.5" (1.689 m)   Wt 127.4 kg   SpO2 98%   BMI 44.65 kg/m   Physical Exam Vitals and nursing note reviewed.  Constitutional:      Appearance: She is well-developed.  HENT:     Head: Normocephalic and atraumatic.     Mouth/Throat:     Comments: Teeth normal dentition, no oral lesions or desquamation of oral mucosa, no evidence of bleeding, no oropharyngeal edema or erythema, handling secretions well, no stridor Eyes:     Conjunctiva/sclera: Conjunctivae normal.     Pupils:  Pupils are equal, round, and reactive to light.  Cardiovascular:     Rate and Rhythm: Normal rate and regular rhythm.     Heart sounds: Normal heart sounds.  Pulmonary:     Effort: Pulmonary effort is normal.     Breath sounds: Normal breath sounds. No wheezing, rhonchi or rales.     Comments: Lungs clear, no distress noted, speaking in paragraphs without issues Abdominal:     General: Bowel sounds are normal.     Palpations: Abdomen is soft.  Musculoskeletal:        General: Normal range of motion.     Cervical back: Normal range of motion.  Skin:    General: Skin is warm and dry.  Neurological:     Mental Status: She is alert and oriented to person, place, and time.    ED Results / Procedures /  Treatments   Labs (all labs ordered are listed, but only abnormal results are displayed) Labs Reviewed  BASIC METABOLIC PANEL - Abnormal; Notable for the following components:      Result Value   Creatinine, Ser 1.16 (*)    GFR, Estimated 60 (*)    All other components within normal limits  CBC - Abnormal; Notable for the following components:   RBC 6.58 (*)    HCT 46.6 (*)    MCV 70.8 (*)    MCH 20.7 (*)    MCHC 29.2 (*)    RDW 18.0 (*)    All other components within normal limits  I-STAT BETA HCG BLOOD, ED ([facility], [facility], [facility] ONLY)  TROPONIN I (HIGH SENSITIVITY)  TROPONIN I (HIGH SENSITIVITY)    EKG EKG Interpretation  Date/Time:  Wednesday Jul 18 2021 21:39:26 EDT Ventricular Rate:  112 PR Interval:  180 QRS Duration: 86 QT Interval:  324 QTC Calculation: 442 R Axis:   70 Text Interpretation: Sinus tachycardia Cannot rule out Anterior infarct , age undetermined Abnormal ECG No previous ECGs available Confirmed by Kommor, Madison ([id number]) on 07/19/2021 5:49:44 AM  Radiology DG Chest 1 View  Result Date: 07/18/2021 CLINICAL DATA:  10026. Shortness of breath from smelling chemicals last Thursday. EXAM: CHEST  1 VIEW COMPARISON:  Cxr 03/04/14 FINDINGS: The heart and mediastinal contours are within normal limits. No focal consolidation. No pulmonary edema. No pleural effusion. No pneumothorax. No acute osseous abnormality. IMPRESSION: No active disease. Electronically Signed   By: Iven Finn M.D.   On: 07/18/2021 22:22    Procedures Procedures    Medications Ordered in ED Medications  albuterol (VENTOLIN HFA) 108 (90 Base) MCG/ACT inhaler 2 puff (has no administration in time range)    ED Course/ Medical Decision Making/ A&P                           Medical Decision Making Risk Prescription drug management.   46 year old female presenting to the ED with chest pain and shortness of breath after chemical exposure that occurred 1 week ago after mixing bleach with hair  developer.  Has since avoided staying in her apartment has been wearing respirator mask when entering the building.  She has no history of asthma or other baseline respiratory issues.  Afebrile, nontoxic in appearance.  Lungs clear without wheezes or rhonchi.  She is no acute respiratory distress, speaking in paragraphs without issue.  Mouth is normal in appearance, no oral lesions or desquamation of the mucosal tissue noted.  She has no oropharyngeal edema or erythema, handling  secretions well, no stridor.  No evidence of airway compromise.  EKG is non-ischemic.  Labs are reassuring--no anemia, no electrolyte derangement, troponin negative x2.  Chest x-ray is clear without focal infiltrate.  Results discussed with patient.  Suspect she likely is experiencing some bronchospasm from breathing and chemicals.  She was given albuterol inhaler to use PRN.  Advised to avoid her apartment until fully aired out and no longer irritating for her to breath.  Can follow-up with PCP.  Return here for new concerns.  Final Clinical Impression(s) / ED Diagnoses Final diagnoses:  Bronchospasm    Rx / DC Orders ED Discharge Orders     None         Larene Pickett, PA-C 07/19/21 0555    Teressa Lower, MD 07/19/21 [phone]

## 2021-07-19 NOTE — Discharge Instructions (Signed)
We suspect you are likely having bronchospasm from breathing in the chemical.  This can be irritating and cause some tightness. Can use inhaler when needed. Follow-up with your primary care doctor. Return here for new concerns.

## 2021-08-15 ENCOUNTER — Ambulatory Visit: Payer: Commercial Managed Care - HMO | Admitting: Pulmonary Disease

## 2021-08-15 ENCOUNTER — Encounter: Payer: Self-pay | Admitting: Pulmonary Disease

## 2021-08-15 VITALS — BP 124/82 | HR 74 | Temp 98.4°F | Ht 66.0 in | Wt 285.2 lb

## 2021-08-15 DIAGNOSIS — R0602 Shortness of breath: Secondary | ICD-10-CM

## 2021-08-15 NOTE — Patient Instructions (Signed)
I will see you back in about 6 to 8 weeks  We will schedule you for breathing study that can be done on the same day you come in  Call with significant symptoms  Use your albuterol inhaler as needed for shortness of breath, wheezing  You may need to see a GI doctor if you are having difficulty swallowing, food or medications getting stuck as you are trying to swallow or with worsening symptoms  A bronchoscopy is where we may take a look in your breathing passage-I do not think this will be helpful at present  Call with significant concerns

## 2021-08-15 NOTE — Progress Notes (Signed)
Kristin Harper    [id number]    March 26, 1975  Primary Care Physician:Pcp, No  Referring Physician: No referring provider defined for this encounter.  Chief complaint:   Patient was exposed to some chemical fumes  HPI:  Patient had combined some hair treatment chemicals -Clairol BW 2 powder [employer] and [employer] sales professional -The combination released some fumes which she was exposed to for brief time she left the apartment.  She came back into the apartment but was unable to get the windows open, spent a couple of days in this environment  Develop some chest tightness She did feel that her tongue and roof of her mouth felt burnt/irritated She did spit up some blood with aspirate  She has not been coughing, she has not been wheezing She did visit the emergency department where she was given a prescription for albuterol Chest x-ray during the visit was negative for any significant finding  Denies any significant difficulty swallowing  She does describe some chest tightness  Has a history of hypertension History of obstructive sleep apnea following which she had surgical intervention that helped  Has no underlying breathing problems Never smoked No pertinent occupational history   Outpatient Encounter Medications as of 08/15/2021  Medication Sig   acetaZOLAMIDE (DIAMOX) 500 MG capsule Take 500 mg by mouth daily. Is diamox er per pt   [DISCONTINUED] acetaminophen (TYLENOL) 500 MG tablet Take 2 tablets (1,000 mg total) by mouth every 6 (six) hours.   [DISCONTINUED] ibuprofen (ADVIL) 800 MG tablet Take 1 tablet (800 mg total) by mouth every 8 (eight) hours.   [DISCONTINUED] menthol-cetylpyridinium (CEPACOL) 3 MG lozenge Take 1 lozenge (3 mg total) by mouth every 2 (two) hours as needed for sore throat.   [DISCONTINUED] OVER THE COUNTER MEDICATION Vita fusion iron 1 tab daily   [DISCONTINUED] oxyCODONE (OXY IR/ROXICODONE) 5 MG immediate release tablet Take 1-2  tablets (5-10 mg total) by mouth every 4 (four) hours as needed for moderate pain, severe pain or breakthrough pain.   No facility-administered encounter medications on file as of 08/15/2021.    Allergies as of 08/15/2021 - Review Complete 08/15/2021  Allergen Reaction Noted   Penicillins Hives and Rash 09/11/2007    Past Medical History:  Diagnosis Date   Abnormal Pap smear    Over 10 years ago per pt, pre cancerous cells were removed.    Acid reflux    Anemia    Fibroid    Hx of varicella    as child   PCOS (polycystic ovarian syndrome) 09/26/2020   Premenopausal patient 08/05/2017   Pseudotumor cerebri    takes diamox for, monitored by dr Legrand Como spencer md   Uterine fibroid 09/26/2020    Past Surgical History:  Procedure Laterality Date   CYSTOSCOPY  10/05/2020   Procedure: CYSTOSCOPY;  Surgeon: Waymon Amato, MD;  Location: [facility];  Service: Gynecology;;   DILATION AND CURETTAGE OF UTERUS  02/19/2003   In [place]  02/18/2002   TONSILLECTOMY     2004, turbinate reduction septal reconstruction also   TOTAL LAPAROSCOPIC HYSTERECTOMY WITH SALPINGECTOMY Bilateral 10/05/2020   Procedure: TOTAL LAPAROSCOPIC HYSTERECTOMY WITH SALPINGECTOMY;  Surgeon: Waymon Amato, MD;  Location: [facility];  Service: Gynecology;  Laterality: Bilateral;   TURBINATE RESECTION  02/18/2002    Family History  Problem Relation Age of Onset   Heart attack Maternal Aunt    Hypertension Mother    Other  Father        shot and killed age 34    Social History   Socioeconomic History   Marital status: Single    Spouse name: Not on file   Number of children: Not on file   Years of education: Not on file   Highest education level: Not on file  Occupational History   Not on file  Tobacco Use   Smoking status: Never   Smokeless tobacco: Never  Substance and Sexual Activity   Alcohol use: Not Currently   Drug use: No   Sexual activity: Yes     Partners: Male    Birth control/protection: Condom  Other Topics Concern   Not on file  Social History Narrative   g 1 p 0    No tobacco    internet based business   Works at [employer] Strain: Not on [employer] Insecurity: Not on file  Transportation Needs: Not on file  Physical Activity: Not on file  Stress: Not on file  Social Connections: Not on file  Intimate Partner Violence: Not on file    Review of Systems  Respiratory:  Negative for cough and shortness of breath.   Psychiatric/Behavioral:  Negative for sleep disturbance.     Vitals:   08/15/21 1340  BP: 124/82  Pulse: 74  Temp: 98.4 F (36.9 C)  SpO2: 99%     Physical Exam Constitutional:      Appearance: She is obese.  HENT:     Head: Normocephalic.     Nose: Nose normal.     Mouth/Throat:     Mouth: Mucous membranes are moist.  Cardiovascular:     Rate and Rhythm: Normal rate and regular rhythm.     Heart sounds: No murmur heard.    No friction rub.  Pulmonary:     Effort: No respiratory distress.     Breath sounds: No stridor. No wheezing or rhonchi.  Musculoskeletal:     Cervical back: No rigidity or tenderness.  Neurological:     Mental Status: She is alert.  Psychiatric:        Mood and Affect: Mood normal.    Data Reviewed: Most recent chest x-ray on 531 reviewed-no acute infiltrate  Assessment:  Exposure to chemical fumes  This does not appear to have caused significant airway dysfunction  No symptoms suggesting reactive airway dysfunction  She does have an albuterol to use as needed-has not been needing this  May have some burn/irritation in the oral cavity which will take time to heal  Plan/Recommendations: Bronchoscopy can be considered with significant symptoms  Schedule patient for pulmonary function test to ascertain any airway dysfunction  She may consider following up with GI if she has any swallowing  difficulties  Tentatively we will see her back in about 6 to 8 weeks  Encouraged to call with significant concerns   Sherrilyn Rist MD [facility] 08/15/2021, 2:14 PM  CC: No ref. provider found

## 2021-09-16 ENCOUNTER — Emergency Department (HOSPITAL_COMMUNITY)
Admission: EM | Admit: 2021-09-16 | Discharge: 2021-09-17 | Disposition: A | Discharge status: Home / Self Care | Payer: Commercial Managed Care - HMO | Attending: Emergency Medicine | Admitting: Emergency Medicine

## 2021-09-16 ENCOUNTER — Emergency Department (HOSPITAL_COMMUNITY): Payer: Commercial Managed Care - HMO

## 2021-09-16 DIAGNOSIS — R7309 Other abnormal glucose: Secondary | ICD-10-CM | POA: Diagnosis not present

## 2021-09-16 DIAGNOSIS — R5383 Other fatigue: Secondary | ICD-10-CM | POA: Diagnosis not present

## 2021-09-16 DIAGNOSIS — R112 Nausea with vomiting, unspecified: Secondary | ICD-10-CM | POA: Insufficient documentation

## 2021-09-16 DIAGNOSIS — R000 Tachycardia, unspecified: Secondary | ICD-10-CM | POA: Insufficient documentation

## 2021-09-16 DIAGNOSIS — R55 Syncope and collapse: Secondary | ICD-10-CM | POA: Diagnosis not present

## 2021-09-16 LAB — CBC WITH DIFFERENTIAL/PLATELET
Abs Immature Granulocytes: 0.02 10*3/uL (ref 0.00–0.07)
Basophils Absolute: 0.1 10*3/uL (ref 0.0–0.1)
Basophils Relative: 1 %
Eosinophils Absolute: 0.1 10*3/uL (ref 0.0–0.5)
Eosinophils Relative: 1 %
HCT: 46.2 % — ABNORMAL HIGH (ref 36.0–46.0)
Hemoglobin: 13.8 g/dL (ref 12.0–15.0)
Immature Granulocytes: 0 %
Lymphocytes Relative: 22 %
Lymphs Abs: 2 10*3/uL (ref 0.7–4.0)
MCH: 21 pg — ABNORMAL LOW (ref 26.0–34.0)
MCHC: 29.9 g/dL — ABNORMAL LOW (ref 30.0–36.0)
MCV: 70.2 fL — ABNORMAL LOW (ref 80.0–100.0)
Monocytes Absolute: 0.6 10*3/uL (ref 0.1–1.0)
Monocytes Relative: 6 %
Neutro Abs: 6.2 10*3/uL (ref 1.7–7.7)
Neutrophils Relative %: 70 %
Platelets: 329 10*3/uL (ref 150–400)
RBC: 6.58 MIL/uL — ABNORMAL HIGH (ref 3.87–5.11)
RDW: 16 % — ABNORMAL HIGH (ref 11.5–15.5)
WBC: 8.9 10*3/uL (ref 4.0–10.5)
nRBC: 0 % (ref 0.0–0.2)

## 2021-09-16 LAB — COMPREHENSIVE METABOLIC PANEL
ALT: 12 U/L (ref 0–44)
AST: 15 U/L (ref 15–41)
Albumin: 3.8 g/dL (ref 3.5–5.0)
Alkaline Phosphatase: 56 U/L (ref 38–126)
Anion gap: 5 (ref 5–15)
BUN: 12 mg/dL (ref 6–20)
CO2: 23 mmol/L (ref 22–32)
Calcium: 9.6 mg/dL (ref 8.9–10.3)
Chloride: 108 mmol/L (ref 98–111)
Creatinine, Ser: 0.99 mg/dL (ref 0.44–1.00)
GFR, Estimated: >60 mL/min (ref >60)
Glucose, Bld: 121 mg/dL — ABNORMAL HIGH (ref 70–99)
Potassium: 3.8 mmol/L (ref 3.5–5.1)
Sodium: 136 mmol/L (ref 135–145)
Total Bilirubin: 0.5 mg/dL (ref 0.3–1.2)
Total Protein: 7.4 g/dL (ref 6.5–8.1)

## 2021-09-16 LAB — I-STAT BETA HCG BLOOD, ED (MC, WL, AP ONLY): I-stat hCG, quantitative: <5 m[IU]/mL (ref <5)

## 2021-09-16 LAB — TROPONIN I (HIGH SENSITIVITY): Troponin I (High Sensitivity): 3 ng/L (ref <18)

## 2021-09-16 LAB — LIPASE, BLOOD: Lipase: 34 U/L (ref 11–51)

## 2021-09-16 LAB — CBG MONITORING, ED: Glucose-Capillary: 119 mg/dL — ABNORMAL HIGH (ref 70–99)

## 2021-09-16 NOTE — ED Triage Notes (Signed)
Pt c/o syncope during concert. Began feeling nauseous/lightheaded, went to stand up & passed out. Pt A&O during triage, denies N/V, seizure hx

## 2021-09-16 NOTE — ED Provider Triage Note (Signed)
Emergency Medicine Provider Triage Evaluation Note  Kristin Harper , a 46 y.o. female  was evaluated in triage.  Pt complains of syncope today. She reports she was at a concert when she started to feel nauseated and lightheaded, went to stand up and passed out, niece at bedside said she was out for 2 minutes- no seizure activity noted. When she woke back up she was not confused. Patient reports she has had problems with chest discomfort/acid reflux for awhile now, some today, but not right prior to syncope. Denies dyspnea, vomiting, diarrhea, leg pain/swelling, prior VTE or prior cancer .  Review of Systems  Per above  Physical Exam  BP (!) 153/100   Pulse (!) 102   Temp 99 F (37.2 C) (Oral)   Resp 18   SpO2 100%  Gen:   Awake, no distress   Resp:  Normal effort  MSK:   Moves extremities without difficulty  Other:  PERRL. No facial droop. Clear speech. Heart RRR  Medical Decision Making  Medically screening exam initiated at 10:51 PM.  Appropriate orders placed.  Kristin Harper was informed that the remainder of the evaluation will be completed by another provider, this initial triage assessment does not replace that evaluation, and the importance of remaining in the ED until their evaluation is complete.  Syncope   Amaryllis Dyke, PA-C 09/16/21 2301

## 2021-09-17 LAB — TROPONIN I (HIGH SENSITIVITY): Troponin I (High Sensitivity): 4 ng/L (ref <18)

## 2021-09-17 NOTE — Discharge Instructions (Addendum)
Your lab work and evaluation today was reassuring.  I definitely think you would benefit from close follow-up with your primary care doctor especially giving the ongoing symptoms you have had since chemical exposure in May.  Given the syncopal episode while seated you could also benefit from follow-up with cardiology and I have placed a cardiology referral.  I would recommend starting Prilosec or Pepcid daily since you are having increased reflux symptoms.  If this is not improving then your doctor may refer you to a GI doctor for further evaluation.

## 2021-09-17 NOTE — ED Provider Notes (Signed)
[facility] EMERGENCY DEPARTMENT Provider Note   CSN: [id number] Arrival date & time: 09/16/21  2237     History  Chief Complaint  Patient presents with   Loss of Consciousness    Kristin Harper is a 46 y.o. female.  Kristin Harper is a 46 yo female with a history of pseudotumor cerebri, GERD, PCOS, uterine fibroid, who presents to the ED after a syncopal episode at about 900 pm yesterday evening. She reports she was sitting at an outdoor concert when her stomach began to feel "funny" and she felt as if she may vomit. She decided to stand to get a ginger ale but fainted before she stood. Her niece witness the event, and states she did not hit her head, did not exhibit any seizure-like activity, and was unconscious for about 1 min. Pt reports she urinated on her self when she passed out. [name] reports she was well hydrated and had eaten that day. She denies any syncopal episodes in the past, and cardiac history, or any family history of cardiac issues at a young age. She denies any HA and reports she is taking her acetazolamide as directed. No chest pain, palpitations of shortness of breath before or after the episode. Pt notes intermittent issues with acid reflux for which she typically takes Tums. She is concerned it may be related to chemicals she mixed when preparing hair product in late May.   The history is provided by the patient and a relative.  Loss of Consciousness Associated symptoms: nausea and vomiting   Associated symptoms: no chest pain, no dizziness, no fever, no headaches, no seizures, no shortness of breath and no weakness        Home Medications Prior to Admission medications   Medication Sig Start Date End Date Taking? Authorizing Provider  acetaZOLAMIDE (DIAMOX) 500 MG capsule Take 500 mg by mouth daily. Is diamox er per pt 12/17/13   [provider]      Allergies    Penicillins    Review of Systems   Review of Systems   Constitutional:  Negative for chills and fever.  HENT: Negative.    Respiratory:  Negative for cough and shortness of breath.   Cardiovascular:  Positive for syncope. Negative for chest pain.  Gastrointestinal:  Positive for abdominal pain, nausea and vomiting. Negative for blood in stool, constipation and diarrhea.  Musculoskeletal:  Negative for arthralgias, back pain, myalgias and neck pain.  Skin:  Negative for pallor, rash and wound.  Neurological:  Positive for syncope and light-headedness. Negative for dizziness, seizures, weakness, numbness and headaches.    Physical Exam Updated Vital Signs BP (!) 147/100   Pulse 86   Temp 98.8 F (37.1 C) (Oral)   Resp 18   SpO2 100%  Physical Exam Vitals and nursing note reviewed.  Constitutional:      General: She is not in acute distress.    Appearance: Normal appearance. She is well-developed. She is not ill-appearing or diaphoretic.  HENT:     Head: Normocephalic and atraumatic.     Mouth/Throat:     Mouth: Mucous membranes are moist.     Pharynx: Oropharynx is clear.  Eyes:     General:        Right eye: No discharge.        Left eye: No discharge.     Extraocular Movements: Extraocular movements intact.     Pupils: Pupils are equal, round, and reactive to light.  Cardiovascular:  Rate and Rhythm: Normal rate and regular rhythm.     Pulses: Normal pulses.     Heart sounds: Normal heart sounds.  Pulmonary:     Effort: Pulmonary effort is normal. No respiratory distress.     Breath sounds: Normal breath sounds. No wheezing or rales.     Comments: Respirations equal and unlabored, patient able to speak in full sentences, lungs clear to auscultation bilaterally  Abdominal:     General: Bowel sounds are normal. There is no distension.     Palpations: Abdomen is soft. There is no mass.     Tenderness: There is no abdominal tenderness. There is no guarding.     Comments: Abdomen soft, nondistended, nontender to palpation in  all quadrants without guarding or peritoneal signs  Musculoskeletal:        General: No deformity.     Cervical back: Neck supple.  Skin:    General: Skin is warm and dry.     Capillary Refill: Capillary refill takes less than 2 seconds.  Neurological:     Mental Status: She is alert and oriented to person, place, and time.     Coordination: Coordination normal.     Comments: Speech is clear, able to follow commands Moves extremities without ataxia, coordination intact  Psychiatric:        Mood and Affect: Mood normal.        Behavior: Behavior normal.     ED Results / Procedures / Treatments   Labs (all labs ordered are listed, but only abnormal results are displayed) Labs Reviewed  COMPREHENSIVE METABOLIC PANEL - Abnormal; Notable for the following components:      Result Value   Glucose, Bld 121 (*)    All other components within normal limits  CBC WITH DIFFERENTIAL/PLATELET - Abnormal; Notable for the following components:   RBC 6.58 (*)    HCT 46.2 (*)    MCV 70.2 (*)    MCH 21.0 (*)    MCHC 29.9 (*)    RDW 16.0 (*)    All other components within normal limits  CBG MONITORING, ED - Abnormal; Notable for the following components:   Glucose-Capillary 119 (*)    All other components within normal limits  LIPASE, BLOOD  I-STAT BETA HCG BLOOD, ED ([facility], [facility], [facility] ONLY)  TROPONIN I (HIGH SENSITIVITY)  TROPONIN I (HIGH SENSITIVITY)    EKG EKG Interpretation  Date/Time:  Sunday September 16 2021 22:54:27 EDT Ventricular Rate:  107 PR Interval:  176 QRS Duration: 86 QT Interval:  338 QTC Calculation: 451 R Axis:   85 Text Interpretation: Sinus tachycardia Possible Inferior infarct , age undetermined Possible Anterolateral infarct , age undetermined When compared with ECG of 18-Jul-2021 21:39, PREVIOUS ECG IS PRESENT No significant change since last tracing Confirmed by Blanchie Dessert [phone]) on 09/17/2021 7:50:25 AM  Radiology DG Chest 2 View  Result Date:  09/16/2021 CLINICAL DATA:  Chest pain.  Patient fainted at a concert. EXAM: CHEST - 2 VIEW COMPARISON:  07/18/2021 FINDINGS: The heart size and mediastinal contours are within normal limits. Both lungs are clear. The visualized skeletal structures are unremarkable. IMPRESSION: No active cardiopulmonary disease. Electronically Signed   By: Lucienne Capers M.D.   On: 09/16/2021 23:21    Procedures Procedures    Medications Ordered in ED Medications - No data to display  ED Course/ Medical Decision Making/ A&P  Medical Decision Making  46 y.o. female presents to the ED with complaints of syncope, this involves an extensive number of treatment options, and is a complaint that carries with it a high risk of complications and morbidity.  The differential diagnosis includes vasovagal syncope, dehydration, arrhythmia, seizure, (Ddx)  On arrival pt is nontoxic, vitals WNL. Exam without significant findings  Additional history obtained from niece at bedside who witnessed syncopal event. Previous records obtained and reviewed   EKG: Normal sinus rhythm without ischemic changes.  Lab Tests:  I Ordered, reviewed, and interpreted labs, which included: no leukocytosis, no anemia, no electrolyte derangements and normal renal function, troponin negative x2. Negative preganacy  Imaging Studies ordered:  I ordered imaging studies which included CXR, I independently visualized and interpreted imaging which showed no active cardiopulmonary disease  ED Course:   Patient with syncopal episode, she was seated but did have some prodromal symptoms.  She has no cardiac history or history of prior syncopal episodes and since being here in the emergency department has not had any additional syncopal or near syncopal symptoms.  She does not meet any of the [facility] syncope criteria and given her reassuring work-up here in the emergency department feel she is stable for discharge with  close outpatient follow-up.  Cardiology referral provided and patient will also follow-up with her primary care provider.  Patient is concerned about possible chemical exposure 2 months ago and that it might be affecting her symptoms today.  Tried to provide patient reassurance with this and explained that we are unable to test for an unknown chemical exposure and that she should follow-up with her primary care doctor if she continues to have symptoms or concerns regarding this.    Portions of this note were generated with [employer]. Dictation errors may occur despite best attempts at proofreading.         Final Clinical Impression(s) / ED Diagnoses Final diagnoses:  Syncope, unspecified syncope type    Rx / DC Orders ED Discharge Orders          Ordered    Ambulatory referral to Cardiology       Comments: If you have not heard from the Cardiology office within the next 72 hours please call [phone].   09/17/21 0754              Jacqlyn Larsen, PA-C 09/17/21 1237    Blanchie Dessert, MD 09/18/21 [phone]

## 2021-09-17 NOTE — ED Notes (Signed)
PA at bedside.

## 2021-09-24 ENCOUNTER — Telehealth: Payer: Self-pay

## 2021-09-24 NOTE — Telephone Encounter (Signed)
Pt was last seen on 04/22/2017. Pt called to ask if MD would be willing to take her back as a patient.  Pt has [employer].  Please advise. [phone]

## 2021-09-27 NOTE — Telephone Encounter (Signed)
Ok to re-establish 

## 2021-09-28 NOTE — Telephone Encounter (Signed)
Pt has been sch for 12-03-2021 130 pm

## 2021-09-28 NOTE — Telephone Encounter (Signed)
Called Pt to schedule OV. No answer, left a detailed message for her to call back and schedule with Dr. Regis Bill.

## 2021-10-09 ENCOUNTER — Ambulatory Visit: Payer: Commercial Managed Care - HMO | Admitting: Cardiovascular Disease

## 2021-11-05 ENCOUNTER — Ambulatory Visit: Payer: Commercial Managed Care - HMO | Admitting: Pulmonary Disease

## 2021-12-03 ENCOUNTER — Encounter: Payer: Self-pay | Admitting: Internal Medicine

## 2021-12-03 ENCOUNTER — Ambulatory Visit (INDEPENDENT_AMBULATORY_CARE_PROVIDER_SITE_OTHER): Payer: Commercial Managed Care - HMO | Admitting: Internal Medicine

## 2021-12-03 VITALS — BP 116/86 | HR 78 | Temp 98.4°F | Ht 66.0 in | Wt 287.6 lb

## 2021-12-03 DIAGNOSIS — R0989 Other specified symptoms and signs involving the circulatory and respiratory systems: Secondary | ICD-10-CM | POA: Diagnosis not present

## 2021-12-03 DIAGNOSIS — L819 Disorder of pigmentation, unspecified: Secondary | ICD-10-CM

## 2021-12-03 DIAGNOSIS — Z87898 Personal history of other specified conditions: Secondary | ICD-10-CM

## 2021-12-03 DIAGNOSIS — T6591XS Toxic effect of unspecified substance, accidental (unintentional), sequela: Secondary | ICD-10-CM

## 2021-12-03 DIAGNOSIS — R049 Hemorrhage from respiratory passages, unspecified: Secondary | ICD-10-CM | POA: Diagnosis not present

## 2021-12-03 DIAGNOSIS — Z7729 Contact with and (suspected ) exposure to other hazardous substances: Secondary | ICD-10-CM | POA: Diagnosis not present

## 2021-12-03 MED ORDER — KETOCONAZOLE 2 % EX CREA: 1.0000 | TOPICAL_CREAM | Freq: Two times a day (BID) | CUTANEOUS | 1 refills | Status: DC

## 2021-12-03 NOTE — Patient Instructions (Addendum)
If the bleeding recurs then advise see ENT. For good exam I don't think the   rash is related  could be skin fungus.   Ketoconazole  is a good  topical for that but can refer. If ongoing   Keep cards appointment.

## 2021-12-03 NOTE — Progress Notes (Signed)
Chief Complaint  Patient presents with   Establish Care    HPI: Patient  Kristin Harper  46 y.o. comes in today to reestablish  last visit was over 3 years ago  She has a concern about symptoms related to an inhalant exposure May 2023. Trying to bleach hair or piece and was mixing products that was told was okay but was may be incorrect.  Was on the hairpiece for 20 minutes and had irritating vapors exposure that irritated her and had a nauseating feeling and made her want to have a bowel movement.  She developed odor around on her close and  mizing products and was maybe incorrect   and was on hair piece for 20 minutes  and  put outside .  Couldn't open windows  closed painted shut .  Had to leave house  at that time.  Day outside of the residents, the odor lingered in hours for about 1.5 days   and  had a gated clothes  Could taste in mouth and had a sore throat and a feeling of burning on the top of her hard palate.  This lasted for quite a while had st    mom had cough for a week.  Had minimal exposure would  stay with mom   ( [place])     She states that up until about 2 weeks ago she would have "blood in her saliva" especially when she would wake up in the morning.  No significant cough although some throat symptoms.  No fever face pain respiratory distress.     Felt like had burn feeling top of mouth.  Did see a pulmonary doctor and go to the emergency room see evaluation fortunately no abnormal in the testing. However in the summer she went to the hospital after passing out at a concert when sitting down and feeling nauseous but not a respiratory symptom she had eaten that day and is never fainted in her life.  She does have a future cardiology appointment.  During his syncope which was transient she did have incontinence but no seizure activity Asks about   pigment changes  on forearms  ( had 2 spots on chest ) and UC  said ? Sun spots and given cream   x 2   eventually went away.   This was Nizoral cream.  However she has developed them recently on her forearms without any symptoms and wonder if it is related to her exposures.   Hysterectomy  2021 .    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  See history sheet for further information   ROS:  REST of 12 system review negative except as per HPI no current chest pain shortness of breath did have gasping at 1 point hearing vision okay no wheezing.  No further syncope or presyncope no predating illness before these events.   Past Medical History:  Diagnosis Date   Abnormal Pap smear    Over 10 years ago per pt, pre  cancerous cells were removed.    Acid reflux    Anemia    Fibroid    Hx of varicella    as child   PCOS (polycystic ovarian syndrome) 09/26/2020   Premenopausal patient 08/05/2017   Pseudotumor cerebri    takes diamox for, monitored by dr Legrand Como spencer md   Syncope    Uterine fibroid 09/26/2020    Past Surgical History:  Procedure Laterality Date   CYSTOSCOPY  10/05/2020   Procedure: CYSTOSCOPY;  Surgeon: Waymon Amato, MD;  Location: [facility];  Service: Gynecology;;   DILATION AND CURETTAGE OF UTERUS  02/19/2003   In [place]  02/18/2002   TONSILLECTOMY     2004, turbinate reduction septal reconstruction also   TOTAL LAPAROSCOPIC HYSTERECTOMY WITH SALPINGECTOMY Bilateral 10/05/2020   Procedure: TOTAL LAPAROSCOPIC HYSTERECTOMY WITH SALPINGECTOMY;  Surgeon: Waymon Amato, MD;  Location: [facility];  Service: Gynecology;  Laterality:  Bilateral;   TURBINATE RESECTION  02/18/2002    Family History  Problem Relation Age of Onset   Heart attack Maternal Aunt    Hypertension Mother    Other Father        shot and killed age 91    Social History   Socioeconomic History   Marital status: Single    Spouse name: Not on file   Number of children: Not on file   Years of education: Not on file   Highest education level: Not on file  Occupational History   Not on file  Tobacco Use   Smoking status: Never   Smokeless tobacco: Never  Vaping Use   Vaping Use: Never used  Substance and Sexual Activity   Alcohol use: Not Currently   Drug use: No   Sexual activity: Not Currently    Partners: Male    Birth control/protection: Condom  Other Topics Concern   Not on file  Social History Narrative   g 1 p 0    No tobacco    internet based business   Works at [employer] Strain: Not on [employer] Insecurity: Not on file  Transportation Needs: Not on file  Physical Activity: Not on file  Stress: Not on file  Social Connections: Not on file    Outpatient Medications Prior to Visit  Medication Sig Dispense Refill   acetaZOLAMIDE (DIAMOX) 500 MG capsule Take 500 mg by mouth daily. Is diamox er per pt  3   No facility-administered medications prior to visit.     EXAM:  BP 116/86 (BP Location: Left Arm, Patient Position: Sitting, Cuff Size: Large)   Pulse 78   Temp 98.4 F (36.9 C) (Oral)   Ht '5\' 6"'$  (1.676 m)   Wt 287 lb 9.6 oz (130.5 kg)   SpO2 95%   BMI 46.42 kg/m   Body mass index is 46.42 kg/m. Wt Readings from Last 3 Encounters:  12/03/21 287 lb 9.6 oz (130.5 kg)  08/15/21 285 lb 4 oz (129.4 kg)  07/19/21 280 lb 13.9 oz (127.4 kg)    Physical Exam: Vital signs reviewed CVE:LFYB is a well-developed well-nourished alert cooperative    who appearsr stated age in no acute distress.  HEENT: normocephalic atraumatic , Eyes: PERRL EOM's full,  conjunctiva clear, Nares: paten,t no deformity discharge or tenderness., Ears: no deformity EAC's clear TMs with normal landmarks. Mouth: clear OP, no lesions, edema.  Moist mucous membranes. Dentition in adequate repair.  NECK: supple without masses, thyromegaly or bruits. CHEST/PULM:  Clear to auscultation and percussion breath sounds equal no wheeze , rales or rhonchi.  CV: PMI is nondisplaced, S1 S2 no gallops, murmurs, rubs. Peripheral pulses are sent without delay.No JVD .  Extremtities:  No clubbing cyanosis or edema, no acute joint swelling or redness no focal atrophy NEURO:  Oriented x3, cranial nerves 3-12 appear to be intact, no obvious focal weakness,gait within normal limits no abnormal reflexes or asymmetrical SKIN: , no bruising or petechiae.  There is subtle roundish hyperpigmentation blotches on her forearms bilaterally distal none on back chest pretty clear. PSYCH: Oriented, good eye contact, no obvious depression anxiety, cognition and judgment appear normal. LN: no cervical  adenopathy shows a picture of the "saliva mucus that has red to pink-tinged blood in it without clots. Lab Results  Component Value Date   WBC 8.9 09/16/2021   HGB 13.8 09/16/2021   HCT 46.2 (H) 09/16/2021   PLT 329 09/16/2021   GLUCOSE 121 (H) 09/16/2021   CHOL 146 09/11/2007   TRIG 74 09/11/2007   HDL 38.5 (L) 09/11/2007   LDLCALC 93 09/11/2007   ALT 12 09/16/2021   AST 15 09/16/2021   NA 136 09/16/2021   K 3.8 09/16/2021   CL 108 09/16/2021   CREATININE 0.99 09/16/2021   BUN 12 09/16/2021   CO2 23 09/16/2021   TSH 0.83 01/07/2014   HGBA1C 5.6 07/27/2015    BP Readings from Last 3 Encounters:  12/03/21 116/86  09/17/21 (!) 141/100  08/15/21 124/82  ED record review Labs.  ASSESSMENT AND PLAN:  Discussed the following assessment and plan:    ICD-10-CM   1. Respiratory symptoms  R09.89     related to toxic inhalant see text.  Symptoms are pretty much subsided but lasted for quite a  while and suspect the blood was from a higher ENT source    2. Bleeding of the respiratory tract  R04.9    only recently  resolved   if recurrs  advise thorough ENT evaluation    3. Exposure to toxic substance  Z77.29     4. Toxic effect of chemical, accidental or unintentional, sequela  T65.91XS     5. Pigmented skin lesions  L81.9    Suspicious for tinea versicolor although on forearms discussed with patient empiric treatment with ketoconazole at this time do not think it is related to expos    6. Hx of syncope  Z87.898    with incontinence  to see cards     Further record review after patient last it appears that pulmonary function test were ordered but were never done. Currently no lower resp sx  but hx of gasping ( poss from upper airway swelling in past)  Return for depending   on how doing .  Patient Care Team: Dannah Ryles, Standley Brooking, MD as PCP - General (Internal Medicine) Leo Grosser Seymour Bars, MD (Inactive) as Attending Physician (Obstetrics and Gynecology) Gevena Cotton, MD as Consulting Physician (Ophthalmology) Patient Instructions   If the bleeding recurs then advise see ENT. For good exam I don't think the   rash is related  could be skin fungus.   Ketoconazole  is a good  topical for that but can refer. If ongoing   Keep cards appointment.       Standley Brooking. Jermar Colter M.D.

## 2021-12-09 ENCOUNTER — Encounter: Payer: Self-pay | Admitting: Cardiovascular Disease

## 2021-12-09 NOTE — Progress Notes (Signed)
Cardiology Office Note:    Date:  12/10/2021   ID:  Kristin Harper, DOB Dec 27, 1975, MRN [id number]  PCP:  Madelin Headings, MD   [facility] Providers Cardiologist:  Esiah Bazinet    Referring MD: Dartha Lodge, PA-C   Chief Complaint  Patient presents with   Loss of Consciousness     History of Present Illness:    Kristin Harper is a 46 y.o. female with a hx of PCOS, pseudotumor cerebri, syncope  We are asked to see her for further evaluation of an episode of syncope.  The episode of syncope occurred 3-4 months ago while at a concert. ( July 31)   Was out an outdoor concert,  had not had too much to eat Stomach felt queezy,  she stood up and then fell back Was out for several minutes, Had urinary incontenence   Is on Diamox for treatment of her pseudotumor cerebrie. She drinks water frequently   We duscussed the importance of weight loss and of regular exercise   Will get an echo and 14 day event monitor   Wt is 284 lbs    Past Medical History:  Diagnosis Date   Abnormal Pap smear    Over 10 years ago per pt, pre cancerous cells were removed.    Acid reflux    Anemia    Fibroid    Hx of varicella    as child   PCOS (polycystic ovarian syndrome) 09/26/2020   Premenopausal patient 08/05/2017   Pseudotumor cerebri    takes diamox for, monitored by dr Casimiro Needle spencer md   Syncope    Uterine fibroid 09/26/2020    Past Surgical History:  Procedure Laterality Date   CYSTOSCOPY  10/05/2020   Procedure: CYSTOSCOPY;  Surgeon: Hoover Browns, MD;  Location: [facility];  Service: Gynecology;;   DILATION AND CURETTAGE OF UTERUS  02/19/2003   In [place]   NASAL SEPTUM SURGERY  02/18/2002   TONSILLECTOMY     2004, turbinate reduction septal reconstruction also   TOTAL LAPAROSCOPIC HYSTERECTOMY WITH SALPINGECTOMY Bilateral 10/05/2020   Procedure: TOTAL LAPAROSCOPIC HYSTERECTOMY WITH SALPINGECTOMY;  Surgeon: Hoover Browns, MD;  Location: [facility];  Service: Gynecology;  Laterality: Bilateral;   TURBINATE RESECTION  02/18/2002    Current Medications: Current Meds  Medication Sig   acetaZOLAMIDE (DIAMOX) 500 MG capsule Take 500 mg by mouth daily. Is diamox er per pt   ketoconazole (NIZORAL) 2 % cream Apply 1 Application topically 2 (two) times daily. To rash     Allergies:   Penicillins   Social History   Socioeconomic History   Marital status: Single    Spouse name: Not on file   Number of children: Not on file   Years of education: Not on file   Highest education level: Not on file  Occupational History   Not on file  Tobacco Use   Smoking status: Never   Smokeless tobacco: Never  Vaping Use   Vaping Use: Never used  Substance and Sexual Activity   Alcohol use: Not Currently   Drug use: No   Sexual activity: Not Currently    Partners: Male    Birth control/protection: Condom  Other Topics Concern   Not on file  Social History Narrative   g 1 p 0    No tobacco    internet based business   Works at [employer] Strain: Not on file  Food Insecurity: Not on file  Transportation Needs: Not on file  Physical Activity: Not on file  Stress: Not on file  Social Connections: Not on file     Family History: The patient's family history includes Heart attack in her maternal aunt; Hypertension in her mother; Other in her father.  ROS:   Please see the history of present illness.     All other systems reviewed and are negative.  EKGs/Labs/Other Studies Reviewed:    The following studies were reviewed today:   EKG:   September 16, 2021: Sinus tachycardia at 107.  Possible inferior wall myocardial infarction.  Poor R wave progression that is likely due to lead placement.  Recent Labs: 09/16/2021: ALT 12; BUN 12; Creatinine, Ser 0.99; Hemoglobin 13.8; Platelets 329; Potassium 3.8; Sodium 136  Recent Lipid Panel    Component Value Date/Time   CHOL 146  09/11/2007 0954   TRIG 74 09/11/2007 0954   HDL 38.5 (L) 09/11/2007 0954   CHOLHDL 3.8 CALC 09/11/2007 0954   VLDL 15 09/11/2007 0954   LDLCALC 93 09/11/2007 0954     Risk Assessment/Calculations:        Physical Exam:    VS:  BP (!) 130/90   Pulse 87   Ht 5\' 6"  (1.676 m)   Wt 284 lb (128.8 kg)   SpO2 99%   BMI 45.84 kg/m     Wt Readings from Last 3 Encounters:  12/10/21 284 lb (128.8 kg)  12/03/21 287 lb 9.6 oz (130.5 kg)  08/15/21 285 lb 4 oz (129.4 kg)     GEN:  morbildy obese female,   in no acute distress HEENT: Normal NECK: No JVD; No carotid bruits LYMPHATICS: No lymphadenopathy CARDIAC: RRR, no murmurs, rubs, gallops RESPIRATORY:  Clear to auscultation without rales, wheezing or rhonchi  ABDOMEN: Soft, non-tender, non-distended MUSCULOSKELETAL:  No edema; No deformity  SKIN: Warm and dry NEUROLOGIC:  Alert and oriented x 3 PSYCHIATRIC:  Normal affect   ASSESSMENT:    1. Syncope, unspecified syncope type   2. Syncope and collapse   3. Orthostatic hypotension    PLAN:    In order of problems listed above:  Syncope: Patient presents for further evaluation of an episode of syncope that occurred 3 to 4 months ago.  She was at an outdoor concert.  She stood up quickly and then blacked out.  Her friend said that she was out for 3 to 4 minutes.  She had an episode of urinary incontinence. She is had any episodes of syncope since that time.  I suspect that this was an episode of orthostatic hypotension.  She may not have been hydrating quite as well if she needed out at the outdoor concert.  I have urged her to hydrate with water with electrolytes.  We will get a 14-day event monitor for further evaluation.  We will also get an echocardiogram to evaluate her cardiac structure and valvular structure.  I will see her again in 3 months.           Medication Adjustments/Labs and Tests Ordered: Current medicines are reviewed at length with the patient  today.  Concerns regarding medicines are outlined above.  Orders Placed This Encounter  Procedures   LONG TERM MONITOR (3-14 DAYS)   ECHOCARDIOGRAM COMPLETE   No orders of the defined types were placed in this encounter.   Patient Instructions  Medication Instructions:  Your physician recommends that you continue on your current medications as directed. Please refer to  the Current Medication list given to you today.  *If you need a refill on your cardiac medications before your next appointment, please call your pharmacy*   Lab Work: NONE If you have labs (blood work) drawn today and your tests are completely normal, you will receive your results only by: MyChart Message (if you have MyChart) OR A paper copy in the mail If you have any lab test that is abnormal or we need to change your treatment, we will call you to review the results.   Testing/Procedures: 14-day Zio monitor Your physician has recommended that you wear an event monitor. Event monitors are medical devices that record the heart's electrical activity. Doctors most often [place] these monitors to diagnose arrhythmias. Arrhythmias are problems with the speed or rhythm of the heartbeat. The monitor is a small, portable device. You can wear one while you do your normal daily activities. This is usually used to diagnose what is causing palpitations/syncope (passing out).  ECHO Your physician has requested that you have an echocardiogram. Echocardiography is a painless test that uses sound waves to create images of your heart. It provides your doctor with information about the size and shape of your heart and how well your heart's chambers and valves are working. This procedure takes approximately one hour. There are no restrictions for this procedure. Please do NOT wear cologne, perfume, aftershave, or lotions (deodorant is allowed). Please arrive 15 minutes prior to your appointment time.   Follow-Up: At [facility], you and your health needs are our priority.  As part of our continuing mission to provide you with exceptional heart care, we have created designated Provider Care Teams.  These Care Teams include your primary Cardiologist (physician) and Advanced Practice Providers (APPs -  Physician Assistants and Nurse Practitioners) who all work together to provide you with the care you need, when you need it.  Your next appointment:   3 month(s)  The format for your next appointment:   In Person  Provider:   Kristeen Miss, MD  Other Instructions ZIO XT- Long Term Monitor Instructions  Your physician has requested you wear a ZIO patch monitor for 14 days.  This is a single patch monitor. [employer] supplies one patch monitor per enrollment. Additional stickers are not available. Please do not apply patch if you will be having a Nuclear Stress Test,  Echocardiogram, Cardiac CT, MRI, or Chest Xray during the period you would be wearing the  monitor. The patch cannot be worn during these tests. You cannot remove and re-apply the  ZIO XT patch monitor.  Your ZIO patch monitor will be mailed 3 day USPS to your address on file. It may take 3-5 days  to receive your monitor after you have been enrolled.  Once you have received your monitor, please review the enclosed instructions. Your monitor  has already been registered assigning a specific monitor serial # to you.  Billing and Patient Assistance Program Information  We have supplied [employer] with any of your insurance information on file for billing purposes. [employer] offers a sliding scale Patient Assistance Program for patients that do not have  insurance, or whose insurance does not completely cover the cost of the ZIO monitor.  You must apply for the Patient Assistance Program to qualify for this discounted rate.  To apply, please call [employer] at [phone], select option 4, select option 2, ask to apply for  Patient Assistance Program.  Meredeth Ide will ask your household income, and how many  people  are in your household. They will quote your out-of-pocket cost based on that information.  [employer] will also be able to set up a 85-month, interest-free payment plan if needed.  Applying the monitor   Shave hair from upper left chest.  Hold abrader disc by orange tab. Rub abrader in 40 strokes over the upper left chest as  indicated in your monitor instructions.  Clean area with 4 enclosed alcohol pads. Let dry.  Apply patch as indicated in monitor instructions. Patch will be placed under collarbone on left  side of chest with arrow pointing upward.  Rub patch adhesive wings for 2 minutes. Remove white label marked "1". Remove the white  label marked "2". Rub patch adhesive wings for 2 additional minutes.  While looking in a mirror, press and release button in center of patch. A small green light will  flash 3-4 times. This will be your only indicator that the monitor has been turned on.  Do not shower for the first 24 hours. You may shower after the first 24 hours.  Press the button if you feel a symptom. You will hear a small click. Record Date, Time and  Symptom in the Patient Logbook.  When you are ready to remove the patch, follow instructions on the last 2 pages of Patient  Logbook. Stick patch monitor onto the last page of Patient Logbook.  Place Patient Logbook in the blue and white box. Use locking tab on box and tape box closed  securely. The blue and white box has prepaid postage on it. Please place it in the mailbox as  soon as possible. Your physician should have your test results approximately 7 days after the  monitor has been mailed back to [facility].  Call [facility] Customer Care at [phone] if you have questions regarding  your ZIO XT patch monitor. Call them immediately if you see an orange light blinking on your  monitor.  If your monitor falls off in less than 4 days, contact our Monitor  department at [phone].  If your monitor becomes loose or falls off after 4 days call [employer] at [phone] for  suggestions on securing your monitor   Important Information About Sugar         Signed, Kristeen Miss, MD  12/10/2021 9:47 AM    [facility]

## 2021-12-10 ENCOUNTER — Ambulatory Visit: Payer: Commercial Managed Care - HMO | Attending: Cardiovascular Disease | Admitting: Cardiovascular Disease

## 2021-12-10 ENCOUNTER — Ambulatory Visit: Payer: Commercial Managed Care - HMO | Attending: Cardiovascular Disease

## 2021-12-10 ENCOUNTER — Encounter: Payer: Self-pay | Admitting: Cardiovascular Disease

## 2021-12-10 VITALS — BP 130/90 | HR 87 | Ht 66.0 in | Wt 284.0 lb

## 2021-12-10 DIAGNOSIS — I951 Orthostatic hypotension: Secondary | ICD-10-CM

## 2021-12-10 DIAGNOSIS — R55 Syncope and collapse: Secondary | ICD-10-CM

## 2021-12-10 NOTE — Patient Instructions (Signed)
Medication Instructions:  Your physician recommends that you continue on your current medications as directed. Please refer to the Current Medication list given to you today.  *If you need a refill on your cardiac medications before your next appointment, please call your pharmacy*   Lab Work: NONE If you have labs (blood work) drawn today and your tests are completely normal, you will receive your results only by: [place] (if you have MyChart) OR A paper copy in the mail If you have any lab test that is abnormal or we need to change your treatment, we will call you to review the results.   Testing/Procedures: 14-day Zio monitor Your physician has recommended that you wear an event monitor. Event monitors are medical devices that record the heart's electrical activity. Doctors most often [place] these monitors to diagnose arrhythmias. Arrhythmias are problems with the speed or rhythm of the heartbeat. The monitor is a small, portable device. You can wear one while you do your normal daily activities. This is usually used to diagnose what is causing palpitations/syncope (passing out).  ECHO Your physician has requested that you have an echocardiogram. Echocardiography is a painless test that uses sound waves to create images of your heart. It provides your doctor with information about the size and shape of your heart and how well your heart's chambers and valves are working. This procedure takes approximately one hour. There are no restrictions for this procedure. Please do NOT wear cologne, perfume, aftershave, or lotions (deodorant is allowed). Please arrive 15 minutes prior to your appointment time.   Follow-Up: At [facility], you and your health needs are our priority.  As part of our continuing mission to provide you with exceptional heart care, we have created designated Provider Care Teams.  These Care Teams include your primary Cardiologist (physician) and Advanced  Practice Providers (APPs -  Physician Assistants and Nurse Practitioners) who all work together to provide you with the care you need, when you need it.  Your next appointment:   3 month(s)  The format for your next appointment:   In Person  Provider:   Mertie Moores, MD  Other Instructions ZIO XT- Long Term Monitor Instructions  Your physician has requested you wear a ZIO patch monitor for 14 days.  This is a single patch monitor. [employer] supplies one patch monitor per enrollment. Additional stickers are not available. Please do not apply patch if you will be having a Nuclear Stress Test,  Echocardiogram, Cardiac CT, MRI, or Chest Xray during the period you would be wearing the  monitor. The patch cannot be worn during these tests. You cannot remove and re-apply the  ZIO XT patch monitor.  Your ZIO patch monitor will be mailed 3 day USPS to your address on file. It may take 3-5 days  to receive your monitor after you have been enrolled.  Once you have received your monitor, please review the enclosed instructions. Your monitor  has already been registered assigning a specific monitor serial # to you.  Billing and Patient Assistance Program Information  We have supplied [employer] with any of your insurance information on file for billing purposes. [employer] offers a sliding scale Patient Assistance Program for patients that do not have  insurance, or whose insurance does not completely cover the cost of the ZIO monitor.  You must apply for the Patient Assistance Program to qualify for this discounted rate.  To apply, please call [employer] at [phone], select option 4, select option 2,  ask to apply for  Patient Assistance Program. Theodore Demark will ask your household income, and how many people  are in your household. They will quote your out-of-pocket cost based on that information.  [employer] will also be able to set up a 42-month interest-free payment plan if needed.  Applying the  monitor   Shave hair from upper left chest.  Hold abrader disc by orange tab. Rub abrader in 40 strokes over the upper left chest as  indicated in your monitor instructions.  Clean area with 4 enclosed alcohol pads. Let dry.  Apply patch as indicated in monitor instructions. Patch will be placed under collarbone on left  side of chest with arrow pointing upward.  Rub patch adhesive wings for 2 minutes. Remove white label marked "1". Remove the white  label marked "2". Rub patch adhesive wings for 2 additional minutes.  While looking in a mirror, press and release button in center of patch. A small green light will  flash 3-4 times. This will be your only indicator that the monitor has been turned on.  Do not shower for the first 24 hours. You may shower after the first 24 hours.  Press the button if you feel a symptom. You will hear a small click. Record Date, Time and  Symptom in the Patient Logbook.  When you are ready to remove the patch, follow instructions on the last 2 pages of Patient  Logbook. Stick patch monitor onto the last page of Patient Logbook.  Place Patient Logbook in the blue and white box. Use locking tab on box and tape box closed  securely. The blue and white box has prepaid postage on it. Please place it in the mailbox as  soon as possible. Your physician should have your test results approximately 7 days after the  monitor has been mailed back to [facility]  Call [place] [phone] you have questions regarding  your ZIO XT patch monitor. Call them immediately if you see an orange light blinking on your  monitor.  If your monitor falls off in less than 4 days, contact our Monitor department at [phone]  If your monitor becomes loose or falls off after 4 days call [employer] at [phone]  suggestions on securing your monitor   Important Information About [place]

## 2021-12-10 NOTE — Progress Notes (Unsigned)
Enrolled for [employer] to mail a ZIO XT long term holter monitor to the patients address on file.  

## 2021-12-18 DIAGNOSIS — R55 Syncope and collapse: Secondary | ICD-10-CM | POA: Diagnosis not present

## 2021-12-27 ENCOUNTER — Other Ambulatory Visit (HOSPITAL_COMMUNITY): Payer: Commercial Managed Care - HMO

## 2022-01-02 ENCOUNTER — Ambulatory Visit (HOSPITAL_COMMUNITY): Payer: Commercial Managed Care - HMO | Attending: Cardiovascular Disease

## 2022-01-02 DIAGNOSIS — R55 Syncope and collapse: Secondary | ICD-10-CM | POA: Diagnosis not present

## 2022-01-02 LAB — ECHOCARDIOGRAM COMPLETE
Area-P 1/2: 3.61 cm2
S' Lateral: 3.3 cm

## 2022-01-06 ENCOUNTER — Ambulatory Visit (HOSPITAL_COMMUNITY)
Admission: EM | Admit: 2022-01-06 | Discharge: 2022-01-06 | Disposition: A | Discharge status: Home / Self Care | Payer: Commercial Managed Care - HMO

## 2022-01-07 ENCOUNTER — Ambulatory Visit
Admission: EM | Admit: 2022-01-07 | Discharge: 2022-01-07 | Disposition: A | Discharge status: Home / Self Care | Payer: Commercial Managed Care - HMO | Attending: Physician Assistant | Admitting: Physician Assistant

## 2022-01-07 ENCOUNTER — Telehealth: Payer: Self-pay | Admitting: Physician Assistant

## 2022-01-07 VITALS — BP 137/94 | HR 100 | Temp 98.5°F | Resp 18

## 2022-01-07 DIAGNOSIS — J069 Acute upper respiratory infection, unspecified: Secondary | ICD-10-CM | POA: Diagnosis present

## 2022-01-07 DIAGNOSIS — Z1152 Encounter for screening for COVID-19: Secondary | ICD-10-CM | POA: Diagnosis present

## 2022-01-07 LAB — RESP PANEL BY RT-PCR (FLU A&B, COVID) ARPGX2
Influenza A by PCR: NEGATIVE
Influenza B by PCR: NEGATIVE
SARS Coronavirus 2 by RT PCR: POSITIVE — AB

## 2022-01-07 MED ORDER — PSEUDOEPHEDRINE-DM-GG 30-10-100 MG/5ML PO LIQD
10.0000 mL | Freq: Four times a day (QID) | ORAL | 0 refills | Status: DC | PRN
Start: 2022-01-07 — End: 2022-11-22

## 2022-01-07 MED ORDER — PSEUDOEPH-BROMPHEN-DM 30-2-10 MG/5ML PO SYRP: 5.0000 mL | ORAL_SOLUTION | Freq: Four times a day (QID) | ORAL | 0 refills | Status: DC | PRN

## 2022-01-07 NOTE — Telephone Encounter (Signed)
Pharmacy requesting alternative for bromphed- out of stock. Alternative rx sent.

## 2022-01-07 NOTE — ED Triage Notes (Signed)
Pt c/o cough, nasal congestion with drainage, headache, feeling like nose is burning, body aches relieved w/ motrin,   Onset ~ Friday

## 2022-01-07 NOTE — ED Provider Notes (Signed)
[name]    CSN: [id number] Arrival date & time: 01/07/22  1009      History   Chief Complaint Chief Complaint  Patient presents with   Cough    HPI Kristin Harper is a 46 y.o. female.   Patient here today for evaluation of body aches, cough, congestion that started a few days ago.  She reports that she is unsure of fever.  She has not had vomiting or diarrhea.  She has tried ibuprofen which did help with body aches. She notes a co-worker came to work with similar symptoms recently.   The history is provided by the patient.  Cough Associated symptoms: sore throat   Associated symptoms: no chills, no ear pain, no eye discharge, no fever, no shortness of breath and no wheezing     Past Medical History:  Diagnosis Date   Abnormal Pap smear    Over 10 years ago per pt, pre cancerous cells were removed.    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   POLYCYSTIC OVARIAN DISEASE 09/11/2007   ELEVATED BLOOD PRESSURE 09/11/2007    Past Surgical History:  Procedure Laterality Date   CYSTOSCOPY  10/05/2020   Procedure: CYSTOSCOPY;  Surgeon: Waymon Amato, MD;  Location: [facility];  Service: Gynecology;;   DILATION AND CURETTAGE OF UTERUS  02/19/2003   In [place]  02/18/2002   TONSILLECTOMY     2004, turbinate reduction septal reconstruction also   TOTAL LAPAROSCOPIC HYSTERECTOMY WITH  SALPINGECTOMY Bilateral 10/05/2020   Procedure: TOTAL LAPAROSCOPIC HYSTERECTOMY WITH SALPINGECTOMY;  Surgeon: Waymon Amato, MD;  Location: [facility];  Service: Gynecology;  Laterality: Bilateral;   TURBINATE RESECTION  02/18/2002    OB History     Gravida  1   Para      Term      Preterm      AB  1   Living  0      SAB  1   IAB      Ectopic      Multiple      Live Births               Home Medications    Prior to Admission medications   Medication Sig Start Date End Date Taking? Authorizing Provider  brompheniramine-pseudoephedrine-DM 30-2-10 MG/5ML syrup Take 5 mLs by mouth 4 (four) times daily as needed. 01/07/22  Yes Francene Finders, PA-C  acetaZOLAMIDE (DIAMOX) 500 MG capsule Take 500 mg by mouth daily. Is diamox er per pt 12/17/13   [provider]  ketoconazole (NIZORAL) 2 % cream Apply 1 Application topically 2 (two) times daily. To rash 12/03/21   Panosh, Standley Brooking, MD    Family History Family History  Problem Relation Age of Onset   Heart attack Maternal Aunt    Hypertension Mother  Other Father        shot and killed age 44    Social History Social History   Tobacco Use   Smoking status: Never   Smokeless tobacco: Never  Vaping Use   Vaping Use: Never used  Substance Use Topics   Alcohol use: Not Currently   Drug use: No     Allergies   Penicillins   Review of Systems Review of Systems  Constitutional:  Negative for chills and fever.  HENT:  Positive for congestion and sore throat. Negative for ear pain.   Eyes:  Negative for discharge and redness.  Respiratory:  Positive for cough. Negative for shortness of breath and wheezing.   Gastrointestinal:  Negative for abdominal pain, diarrhea, nausea and vomiting.     Physical Exam Triage Vital Signs ED Triage Vitals [01/07/22 1032]  Enc Vitals Group     BP (!) 137/94     Pulse Rate 100     Resp 18     Temp 98.5 F (36.9 C)     Temp Source Oral      SpO2 97 %     Weight      Height      Head Circumference      Peak Flow      Pain Score 5     Pain Loc      Pain Edu?      Excl. in [place]?    No data found.  Updated Vital Signs BP (!) 137/94 (BP Location: Left Arm)   Pulse 100   Temp 98.5 F (36.9 C) (Oral)   Resp 18   SpO2 97%      Physical Exam Vitals and nursing note reviewed.  Constitutional:      General: She is not in acute distress.    Appearance: Normal appearance. She is not ill-appearing.  HENT:     Head: Normocephalic and atraumatic.     Nose: Congestion present.     Mouth/Throat:     Mouth: Mucous membranes are moist.     Pharynx: No oropharyngeal exudate or posterior oropharyngeal erythema.  Eyes:     Conjunctiva/sclera: Conjunctivae normal.  Cardiovascular:     Rate and Rhythm: Normal rate and regular rhythm.     Heart sounds: Normal heart sounds. No murmur heard. Pulmonary:     Effort: Pulmonary effort is normal. No respiratory distress.     Breath sounds: Normal breath sounds. No wheezing, rhonchi or rales.  Skin:    General: Skin is warm and dry.  Neurological:     Mental Status: She is alert.  Psychiatric:        Mood and Affect: Mood normal.        Thought Content: Thought content normal.      UC Treatments / Results  Labs (all labs ordered are listed, but only abnormal results are displayed) Labs Reviewed  RESP PANEL BY RT-PCR (FLU A&B, COVID) [id number]    EKG   Radiology No results found.  Procedures Procedures (including critical care time)  Medications Ordered in UC Medications - No data to display  Initial Impression / Assessment and Plan / UC Course  I have reviewed the triage vital signs and the nursing notes.  Pertinent labs & imaging results that were available during my care of the patient were reviewed by me and considered in my medical decision making (see chart for details).    Suspect viral etiology of symptoms.  Recommended screening for COVID and flu.  Cough  syrup prescribed.  Encouraged follow-up if no gradual improvement or with any further concerns.  Final Clinical Impressions(s) / UC Diagnoses   Final diagnoses:  Viral upper respiratory tract infection  Encounter for screening for COVID-19   Discharge Instructions   None    ED Prescriptions     Medication Sig Dispense Auth. Provider   brompheniramine-pseudoephedrine-DM 30-2-10 MG/5ML syrup Take 5 mLs by mouth 4 (four) times daily as needed. 120 mL Francene Finders, PA-C      PDMP not reviewed this encounter.   Francene Finders, PA-C 01/07/22 1313

## 2022-01-08 ENCOUNTER — Telehealth: Payer: Self-pay | Admitting: Physician Assistant

## 2022-01-08 MED ORDER — NIRMATRELVIR/RITONAVIR (PAXLOVID)TABLET
3.0000 | ORAL_TABLET | Freq: Two times a day (BID) | ORAL | 0 refills | Status: AC
Start: 2022-01-08 — End: 2022-01-13

## 2022-01-08 NOTE — Telephone Encounter (Signed)
Patient called with positive covid results- requesting paxlovid. Paxlovid sent to pharmacy.

## 2022-03-13 ENCOUNTER — Ambulatory Visit: Payer: Commercial Managed Care - HMO | Admitting: Cardiovascular Disease

## 2022-08-16 ENCOUNTER — Ambulatory Visit
Admission: RE | Admit: 2022-08-16 | Discharge: 2022-08-16 | Disposition: A | Discharge status: Home / Self Care | Payer: BC Managed Care – PPO | Source: Ambulatory Visit | Attending: Internal Medicine | Admitting: Internal Medicine

## 2022-08-16 VITALS — BP 137/89 | HR 78 | Temp 98.1°F | Resp 18

## 2022-08-16 DIAGNOSIS — H6592 Unspecified nonsuppurative otitis media, left ear: Secondary | ICD-10-CM

## 2022-08-16 DIAGNOSIS — H60392 Other infective otitis externa, left ear: Secondary | ICD-10-CM

## 2022-08-16 MED ORDER — CETIRIZINE HCL 10 MG PO TABS: 10.0000 mg | ORAL_TABLET | Freq: Every day | ORAL | 0 refills | Status: DC

## 2022-08-16 MED ORDER — FLUTICASONE PROPIONATE 50 MCG/ACT NA SUSP: 1.0000 | Freq: Every day | NASAL | 0 refills | Status: DC

## 2022-08-16 MED ORDER — OFLOXACIN 0.3 % OT SOLN: 10.0000 [drp] | Freq: Every day | OTIC | 0 refills | Status: AC

## 2022-08-16 NOTE — Discharge Instructions (Addendum)
You have fluid behind your eardrum and canal of ear appears inflamed.  I have prescribed you 3 medications to help alleviate symptoms.  Follow-up if any symptoms persist or worsen.

## 2022-08-16 NOTE — ED Provider Notes (Signed)
[name]    CSN: [id number] Arrival date & time: 08/16/22  1329      History   Chief Complaint Chief Complaint  Patient presents with   Otalgia    HPI Kristin Harper is a 47 y.o. female.   Patient presents with left ear pain that started about 3 days ago.  Patient reports that the outside of her ear and the inside is painful.  Denies trauma to the ear.  Denies nasal congestion, runny nose, fever.  Patient reports that she wears ear buds daily at work so is not sure if this is related.  Has not taken any medication for pain.   Otalgia   Past Medical History:  Diagnosis Date   Abnormal Pap smear    Over 10 years ago per pt, pre cancerous cells were removed.    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/2007   POLYCYSTIC OVARIAN DISEASE 09/11/2007   ELEVATED BLOOD PRESSURE 09/11/2007    Past Surgical History:  Procedure Laterality Date   CYSTOSCOPY  10/05/2020   Procedure: CYSTOSCOPY;  Surgeon: Hoover Browns, MD;  Location: [facility];  Service: Gynecology;;   DILATION AND CURETTAGE OF UTERUS  02/19/2003   In [place]   NASAL SEPTUM SURGERY  02/18/2002   TONSILLECTOMY     2004, turbinate reduction septal reconstruction also   TOTAL LAPAROSCOPIC HYSTERECTOMY WITH SALPINGECTOMY Bilateral 10/05/2020   Procedure: TOTAL LAPAROSCOPIC HYSTERECTOMY WITH SALPINGECTOMY;  Surgeon: Hoover Browns, MD;  Location: [facility];  Service: Gynecology;  Laterality: Bilateral;   TURBINATE RESECTION  02/18/2002    OB History     Gravida  1   Para      Term      Preterm      AB  1   Living  0      SAB  1   IAB      Ectopic      Multiple      Live Births               Home Medications    Prior to Admission medications   Medication Sig Start Date End Date Taking? Authorizing Provider  acetaZOLAMIDE (DIAMOX) 500 MG capsule Take 500 mg by mouth daily. Is diamox er per pt 12/17/13  Yes [provider]  cetirizine (ZYRTEC) 10 MG tablet Take 1 tablet (10 mg total) by mouth daily. 08/16/22  Yes Merit Maybee, Rolly Salter E, FNP  fluticasone (FLONASE) 50 MCG/ACT nasal spray Place 1 spray into both nostrils daily. 08/16/22  Yes Excell Neyland, Rolly Salter E, FNP  ofloxacin (FLOXIN) 0.3 % OTIC solution Place 10 drops into the left ear daily for 7 days. 08/16/22 08/23/22 Yes Kong Packett, Acie Fredrickson, FNP  ketoconazole (NIZORAL) 2 % cream Apply 1 Application topically 2 (two) times daily. To rash 12/03/21   Panosh, Neta Mends, MD  pseudoephedrine-dextromethorphan-guaifenesin (ROBITUSSIN-PE) 30-10-100 MG/5ML  solution Take 10 mLs by mouth 4 (four) times daily as needed for cough. 01/07/22   Tomi Bamberger, PA-C    Family History Family History  Problem Relation Age of Onset   Heart attack Maternal Aunt    Hypertension Mother    Other Father        shot and killed age 43    Social History Social History   Tobacco Use   Smoking status: Never   Smokeless tobacco: Never  Vaping Use   Vaping Use: Never used  Substance Use Topics   Alcohol use: Not Currently   Drug use: No     Allergies   Penicillins   Review of Systems Review of Systems Per HPI  Physical Exam Triage Vital Signs ED Triage Vitals  Enc Vitals Group     BP 08/16/22 1346 137/89     Pulse Rate 08/16/22 1346 78     Resp 08/16/22 1346 18     Temp 08/16/22 1346 98.1 F (36.7 C)     Temp Source 08/16/22 1346 Oral     SpO2 08/16/22 1346 96 %      Weight --      Height --      Head Circumference --      Peak Flow --      Pain Score 08/16/22 1342 6     Pain Loc --      Pain Edu? --      Excl. in [place]? --    No data found.  Updated Vital Signs BP 137/89 (BP Location: Right Arm)   Pulse 78   Temp 98.1 F (36.7 C) (Oral)   Resp 18   LMP  (Exact Date)   SpO2 96%   Visual Acuity Right Eye Distance:   Left Eye Distance:   Bilateral Distance:    Right Eye Near:   Left Eye Near:    Bilateral Near:     Physical Exam Constitutional:      General: She is not in acute distress.    Appearance: Normal appearance. She is not toxic-appearing or diaphoretic.  HENT:     Head: Normocephalic and atraumatic.     Left Ear: External ear normal. No mastoid tenderness. Tympanic membrane is not perforated, erythematous or bulging.     Ears:     Comments: Fluid behind tympanic membrane of left ear. Mild erythema with no swelling present to left external canal.  Eyes:     Extraocular Movements: Extraocular movements intact.     Conjunctiva/sclera: Conjunctivae normal.  Pulmonary:     Effort: Pulmonary effort is normal.  Neurological:     General: No focal deficit present.     Mental Status: She is alert and oriented to person, place, and time. Mental status is at baseline.  Psychiatric:        Mood and Affect: Mood normal.        Behavior: Behavior normal.        Thought Content: Thought content normal.        Judgment: Judgment normal.      UC Treatments / Results  Labs (all labs ordered are listed, but only abnormal results are displayed) Labs Reviewed - No data to display  EKG   Radiology No results found.  Procedures Procedures (including critical care time)  Medications Ordered in UC Medications - No data to display  Initial Impression / Assessment and Plan / UC Course  I have reviewed the triage vital signs and the  nursing notes.  Pertinent labs & imaging results that were available during my care of the  patient were reviewed by me and considered in my medical decision making (see chart for details).     Patient has fluid behind TM and possible infection of the left external canal.  Will treat with cetirizine antihistamine, Flonase, ofloxacin eardrops.  She denies that she takes any daily antihistamines or nasal sprays so this should be safe.  Advised follow-up if any symptoms persist or worsen.  Patient verbalized understanding and was agreeable with plan. Final Clinical Impressions(s) / UC Diagnoses   Final diagnoses:  Fluid level behind tympanic membrane of left ear  Infective otitis externa of left ear     Discharge Instructions      You have fluid behind your eardrum and canal of ear appears inflamed.  I have prescribed you 3 medications to help alleviate symptoms.  Follow-up if any symptoms persist or worsen.    ED Prescriptions     Medication Sig Dispense Auth. Provider   cetirizine (ZYRTEC) 10 MG tablet Take 1 tablet (10 mg total) by mouth daily. 30 tablet Leslie, [place] E, [place]   fluticasone [facility]) 50 MCG/ACT nasal spray Place 1 spray into both nostrils daily. 16 g [place], Rolly Salter E, [place]   ofloxacin (FLOXIN) 0.3 % OTIC solution Place 10 drops into the left ear daily for 7 days. 5 mL Gustavus Bryant, [place]      PDMP not reviewed this encounter.   Gustavus Bryant, [place] 08/16/22 1416

## 2022-08-16 NOTE — ED Triage Notes (Signed)
L ear pain worsening x 3 days. Feels like it is starting to swell around ear. Hearing is starting to change. Feels like someone hit her.  Does wear ear bud type device in that ear for work. No drainage, no fevers. Has not tried any drops or meds.

## 2022-08-19 ENCOUNTER — Encounter: Payer: Self-pay | Admitting: Family Medicine

## 2022-08-19 ENCOUNTER — Ambulatory Visit: Payer: BC Managed Care – PPO | Admitting: Family Medicine

## 2022-08-19 VITALS — BP 122/84 | HR 83 | Temp 98.7°F | Wt 303.0 lb

## 2022-08-19 DIAGNOSIS — H60392 Other infective otitis externa, left ear: Secondary | ICD-10-CM | POA: Diagnosis not present

## 2022-08-19 DIAGNOSIS — H66002 Acute suppurative otitis media without spontaneous rupture of ear drum, left ear: Secondary | ICD-10-CM

## 2022-08-19 MED ORDER — CIPROFLOXACIN-DEXAMETHASONE 0.3-0.1 % OT SUSP: 4.0000 [drp] | Freq: Two times a day (BID) | OTIC | 0 refills | Status: DC

## 2022-08-19 MED ORDER — LEVOFLOXACIN 500 MG PO TABS: 500.0000 mg | ORAL_TABLET | Freq: Every day | ORAL | 0 refills | Status: AC

## 2022-08-19 NOTE — Progress Notes (Signed)
   Subjective:    Patient ID: Kristin Harper, female    DOB: March 05, 1975, 47 y.o.   MRN: [id number]  HPI Here for one week of pain in the left ear. She also has muffled hearing in that ear. No ST or cough or fever. She went to urgent care on 08-16-22 and they diagnosed her with otitis externa. They gave her Ofloxacin drops, and these helped a little, but she is still in pain. She took Tylenol and applied warm compresses.    Review of Systems  Constitutional: Negative.   HENT:  Positive for ear pain and hearing loss. Negative for congestion, ear discharge, postnasal drip, sinus pain and sore throat.   Eyes: Negative.   Respiratory: Negative.         Objective:   Physical Exam Constitutional:      Appearance: Normal appearance. She is not ill-appearing.  HENT:     Right Ear: Tympanic membrane, ear canal and external ear normal.     Ears:     Comments: Left canal is red and swollen. The left TM is dull and red and slightly bulging    Nose: Nose normal.     Mouth/Throat:     Pharynx: Oropharynx is clear.  Eyes:     Conjunctiva/sclera: Conjunctivae normal.  Pulmonary:     Effort: Pulmonary effort is normal.     Breath sounds: Normal breath sounds.  Lymphadenopathy:     Cervical: Cervical adenopathy present.  Neurological:     Mental Status: She is alert.           Assessment & Plan:  Left sided otitis externa and media. Stop the Ofloxacin drops and start CirpoDex drops BID she will also take 10 days of Levaquin.  Gershon Crane, MD

## 2022-10-28 ENCOUNTER — Other Ambulatory Visit: Payer: BC Managed Care – PPO | Admitting: Internal Medicine

## 2022-10-28 NOTE — Telephone Encounter (Signed)
Attempted to reach pt. Left a voicemail to call us back.  

## 2022-10-28 NOTE — Telephone Encounter (Signed)
Prescription Request  10/28/2022  LOV: 12/03/2021  What is the name of the medication or equipment? acetaZOLAMIDE (DIAMOX) 500 MG capsule . This med was last prescribed by her eye doctor  Have you contacted your pharmacy to request a refill? No   Which pharmacy would you like this sent to?   [id number] - [place], [place] - [address] AT [address] [place] [address] Phone: [phone] Fax: [phone]    Patient notified that their request is being sent to the clinical staff for review and that they should receive a response within 2 business days.   Please advise at Mobile [phone] (mobile)

## 2022-10-30 NOTE — Telephone Encounter (Signed)
Spoke to pt.  Pt reports she is longer seeing the provider due they now is seeing pediatric pt. Was advise to see another provider to refill Rx.   Okay for refill? Please advise.

## 2022-10-30 NOTE — Telephone Encounter (Signed)
I dont have enough information to answer this request for [place] to begin prescribing this medication Id reason fot the med and history etc  ? Psudotumor? Other ? She needs to establish with a new ophthalmologist although if running out of med we can help do bridge therapy .

## 2022-10-31 NOTE — Telephone Encounter (Signed)
Spoke to pt and update her on provider message. Verbalized understanding.   Pt requesting a bridge therapy until she can find an ophthalmologist. Currently she has some Rx that will last her for 6 days. Advise pt, provider is out for the day until Tuesday.   Verbalized understanding.   Please advise.

## 2022-11-01 ENCOUNTER — Other Ambulatory Visit: Payer: Self-pay

## 2022-11-01 MED ORDER — ACETAZOLAMIDE ER 500 MG PO CP12
500.0000 mg | ORAL_CAPSULE | Freq: Every day | ORAL | 0 refills | Status: DC
Start: 2022-11-01 — End: 2022-11-01

## 2022-11-01 MED ORDER — ACETAZOLAMIDE ER 500 MG PO CP12: 500.0000 mg | ORAL_CAPSULE | Freq: Every day | ORAL | 0 refills | Status: DC

## 2022-11-01 NOTE — Telephone Encounter (Signed)
Bridge rx therapy  sent in

## 2022-11-01 NOTE — Telephone Encounter (Signed)
Update pt on the refill.   Pt request to resend to [facility] on [address].   Rx was resent to requested pharmacy.

## 2022-11-22 ENCOUNTER — Telehealth: Payer: Self-pay | Admitting: Internal Medicine

## 2022-11-22 ENCOUNTER — Ambulatory Visit: Payer: BC Managed Care – PPO | Admitting: Family Medicine

## 2022-11-22 ENCOUNTER — Encounter: Payer: Self-pay | Admitting: Family Medicine

## 2022-11-22 VITALS — BP 128/80 | HR 100 | Temp 98.2°F | Resp 12 | Ht 66.0 in | Wt 315.0 lb

## 2022-11-22 DIAGNOSIS — K644 Residual hemorrhoidal skin tags: Secondary | ICD-10-CM

## 2022-11-22 DIAGNOSIS — Z1211 Encounter for screening for malignant neoplasm of colon: Secondary | ICD-10-CM

## 2022-11-22 DIAGNOSIS — E66813 Obesity, class 3: Secondary | ICD-10-CM

## 2022-11-22 DIAGNOSIS — K625 Hemorrhage of anus and rectum: Secondary | ICD-10-CM

## 2022-11-22 DIAGNOSIS — Z6841 Body Mass Index (BMI) 40.0 and over, adult: Secondary | ICD-10-CM

## 2022-11-22 MED ORDER — HYDROCORTISONE ACETATE 25 MG RE SUPP: 25.0000 mg | Freq: Two times a day (BID) | RECTAL | 0 refills | Status: DC

## 2022-11-22 NOTE — Telephone Encounter (Signed)
Pt is calling and she is aware md gone for today and hydrocortisone (ANUSOL-HC) 25 MG suppository  is not covered and pt would like something similar  [id number] [place], [place] - [address] AT [facility] Phone: [phone]  Fax: [phone]

## 2022-11-22 NOTE — Patient Instructions (Addendum)
A few things to remember from today's visit:  Rectal bleeding - Plan: Ambulatory referral to Gastroenterology  Hemorrhoids, external without complications - Plan: hydrocortisone (ANUSOL-HC) 25 MG suppository  Colon cancer screening - Plan: Ambulatory referral to Gastroenterology  If you need refills for medications you take chronically, please call your pharmacy. Do not use My Chart to request refills or for acute issues that need immediate attention. If you send a my chart message, it may take a few days to be addressed, specially if I am not in the office.  Please be sure medication list is accurate. If a new problem present, please set up appointment sooner than planned today.

## 2022-11-22 NOTE — Progress Notes (Signed)
ACUTE VISIT Chief Complaint  Patient presents with   Hemorrhoids    Unsure if it may be a hemorrhoid or something else, felt something burst when she was up walking around   HPI: [name] is a 47 y.o. female with a PMHx signficant for intramural fibroids, PCOS, and recurrent UTIs, who is here today complaining of possible hemorrhoids.   Rectal Bleeding  The current episode started today. The onset was sudden. The problem has been resolved. The pain is moderate. There was no prior unsuccessful therapy. Associated symptoms include rectal pain. Pertinent negatives include no anorexia, no fever, no abdominal pain, no hematemesis, no hematuria, no vaginal bleeding, no vaginal discharge, no chest pain, no headaches, no coughing, no difficulty breathing and no rash.    She complains of rectal pain and discomfort since 9/26 following a "difficult", hard bowel movement. She states the pain worsened over the last week, exacerbated by sitting and walking. She states that yesterday, it started feeling "like razor blades." Bowel movements have returned to normal, last one was today. She reports this morning while she was walking something "burst", she felt wetness in between gluteus and noted it was blood, rectal pain greatly improved right after.  She has been using preparation H and Vaseline to manage the pain.  She states she normally has very regular bowel movements, but she had to "push a little more" this week.  She denies fever, chills, CP,SOB,palpitations, abdominal pain, nausea, or vomiting. No past Hx of hemorrhoids or rectal bleeding. Fhx negative for IBD or colon cancer.  She says she has never had a colonoscopy.   Obesity: Concerned about wt gained since her last visit. She does not weigh at home. Has not been consistent with following a healthful diet and just started exercising regularly. She is inquiring about wt loss medication, Zepbound.  Review of Systems   Constitutional:  Negative for activity change, appetite change and fever.  HENT:  Negative for mouth sores and sore throat.   Respiratory:  Negative for cough.   Cardiovascular:  Negative for chest pain.  Gastrointestinal:  Positive for hematochezia and rectal pain. Negative for abdominal pain, anorexia and hematemesis.  Endocrine: Negative for cold intolerance and heat intolerance.  Genitourinary:  Negative for hematuria, vaginal bleeding and vaginal discharge.  Skin:  Negative for rash.  Neurological:  Negative for syncope and headaches.  See other pertinent positives and negatives in HPI.  Current Outpatient Medications on File Prior to Visit  Medication Sig Dispense Refill   acetaZOLAMIDE ER (DIAMOX) 500 MG capsule Take 1 capsule (500 mg total) by mouth daily. Is diamox er per pt bridge therapy. 30 capsule 0   cetirizine (ZYRTEC) 10 MG tablet Take 1 tablet (10 mg total) by mouth daily. 30 tablet 0   ciprofloxacin-dexamethasone (CIPRODEX) OTIC suspension Place 4 drops into the left ear 2 (two) times daily. 7.5 mL 0   fluticasone (FLONASE) 50 MCG/ACT nasal spray Place 1 spray into both nostrils daily. 16 g 0   ketoconazole (NIZORAL) 2 % cream Apply 1 Application topically 2 (two) times daily. To rash 30 g 1   No current facility-administered medications on file prior to visit.    Past Medical History:  Diagnosis Date   Abnormal Pap smear    Over 10 years ago per pt, pre cancerous cells were removed.    Acid reflux    Anemia    Fibroid    Hx of varicella    as child   PCOS (  polycystic ovarian syndrome) 09/26/2020   Premenopausal patient 08/05/2017   Pseudotumor cerebri    takes diamox for, monitored by dr Casimiro Needle spencer md   Syncope    Uterine fibroid 09/26/2020   Allergies  Allergen Reactions   Penicillins Hives and Rash    Social History   Socioeconomic History   Marital status: Single    Spouse name: Not on file   Number of children: Not on file   Years of  education: Not on file   Highest education level: Not on file  Occupational History   Not on file  Tobacco Use   Smoking status: Never   Smokeless tobacco: Never  Vaping Use   Vaping status: Never Used  Substance and Sexual Activity   Alcohol use: Not Currently   Drug use: No   Sexual activity: Not Currently    Partners: Male  Other Topics Concern   Not on file  Social History Narrative   g 1 p 0    No tobacco    internet based business   Works at [employer] Strain: Not on [employer] Insecurity: Not on file  Transportation Needs: Not on file  Physical Activity: Not on file  Stress: Not on file  Social Connections: Not on file   Vitals:   11/22/22 1230  BP: 128/80  Pulse: 100  Resp: 12  Temp: 98.2 F (36.8 C)  SpO2: 97%   Wt Readings from Last 3 Encounters:  11/22/22 (!) 315 lb (142.9 kg)  08/19/22 (!) 303 lb (137.4 kg)  12/10/21 284 lb (128.8 kg)   Body mass index is 50.84 kg/m.  Physical Exam Vitals and nursing note reviewed. Exam conducted with a chaperone present.  Constitutional:      General: She is not in acute distress.    Appearance: She is well-developed.  HENT:     Head: Normocephalic and atraumatic.  Eyes:     Conjunctiva/sclera: Conjunctivae normal.  Cardiovascular:     Rate and Rhythm: Normal rate and regular rhythm.     Heart sounds: No murmur heard. Pulmonary:     Effort: Pulmonary effort is normal. No respiratory distress.     Breath sounds: Normal breath sounds.  Abdominal:     Palpations: Abdomen is soft. There is no mass.     Tenderness: There is no abdominal tenderness.  Genitourinary:    Exam position: Knee-chest position.     Comments: When pt lying on left side: External hemorrhoid ar 5 O'clock with superficial ulceration, no active bleeding, tender. No surrounded erythema or induration. Skin:    General: Skin is warm.     Findings: No erythema or rash.  Neurological:      General: No focal deficit present.     Mental Status: She is alert and oriented to person, place, and time.     Cranial Nerves: No cranial nerve deficit.     Gait: Gait normal.  Psychiatric:     Comments: Well groomed, good eye contact.    ASSESSMENT AND PLAN:  [name] was seen today for possible hemorrhoids.   Rectal bleeding Seem to be generated from a thrombosed hemorrhoid and has resolved. Recommend GI evaluation and colonoscopy. I do not think blood work is needed at this time. Instructed about warning signs.  -     Ambulatory referral to Gastroenterology  Hemorrhoids, external without complications We discussed Dx and prognosis. Dyschezia has greatly improved. Anusol sup bid  for 3-5 days recommended as well as sitz bath daily for 3-4 days. Avoid constipation, continue adequate hydration and fiber intake.  -     Hydrocortisone Acetate; Place 1 suppository (25 mg total) rectally 2 (two) times daily.  Dispense: 12 suppository; Refill: 0  Class 3 severe obesity with serious comorbidity and body mass index (BMI) of 45.0 to 49.9 in adult, unspecified obesity type (HCC) She understands the benefits of wt loss as well as adverse effects of obesity. Consistency with healthy diet and physical activity encouraged. She can discuss pharmacologic treatment options or referral to wt loss clinic during next visit with PCP.  Colon cancer screening -     Ambulatory referral to Gastroenterology  Return if symptoms worsen or fail to improve.  I, Rolla Etienne Wierda, acting as a scribe for Crista Nuon [place], MD., have documented all relevant documentation on the behalf of Kristin Enrico [place], MD, as directed by  Kristin Kann [place], MD while in the presence of Kristin Keator [place], MD.   I, Khushi Zupko [place], MD, have reviewed all documentation for this visit. The documentation on 11/22/22 for the exam, diagnosis, procedures, and orders are all accurate and complete.  Janeli Lewison G. [place], MD  [facility]. [place] office.

## 2022-11-23 ENCOUNTER — Other Ambulatory Visit: Payer: Self-pay | Admitting: Family

## 2022-11-23 MED ORDER — HYDROCORTISONE ACETATE 30 MG RE SUPP
1.0000 | Freq: Two times a day (BID) | RECTAL | 0 refills | Status: DC
Start: 2022-11-23 — End: 2023-05-20

## 2022-11-24 ENCOUNTER — Other Ambulatory Visit: Payer: Self-pay | Admitting: Family

## 2022-12-10 ENCOUNTER — Encounter: Payer: Self-pay | Admitting: Internal Medicine

## 2022-12-10 ENCOUNTER — Other Ambulatory Visit (INDEPENDENT_AMBULATORY_CARE_PROVIDER_SITE_OTHER): Payer: BC Managed Care – PPO

## 2022-12-10 ENCOUNTER — Ambulatory Visit: Payer: BC Managed Care – PPO | Admitting: Internal Medicine

## 2022-12-10 VITALS — BP 126/89 | HR 111 | Temp 98.9°F | Ht 66.0 in | Wt 311.8 lb

## 2022-12-10 DIAGNOSIS — K625 Hemorrhage of anus and rectum: Secondary | ICD-10-CM

## 2022-12-10 DIAGNOSIS — E282 Polycystic ovarian syndrome: Secondary | ICD-10-CM

## 2022-12-10 DIAGNOSIS — G932 Benign intracranial hypertension: Secondary | ICD-10-CM | POA: Diagnosis not present

## 2022-12-10 DIAGNOSIS — G4730 Sleep apnea, unspecified: Secondary | ICD-10-CM

## 2022-12-10 DIAGNOSIS — R0600 Dyspnea, unspecified: Secondary | ICD-10-CM

## 2022-12-10 DIAGNOSIS — R739 Hyperglycemia, unspecified: Secondary | ICD-10-CM

## 2022-12-10 DIAGNOSIS — Z79899 Other long term (current) drug therapy: Secondary | ICD-10-CM

## 2022-12-10 LAB — CBC WITH DIFFERENTIAL/PLATELET
Basophils Absolute: 0.1 10*3/uL (ref 0.0–0.1)
Basophils Relative: 0.9 % (ref 0.0–3.0)
Eosinophils Absolute: 0.2 10*3/uL (ref 0.0–0.7)
Eosinophils Relative: 2.1 % (ref 0.0–5.0)
HCT: 45.4 % (ref 36.0–46.0)
Hemoglobin: 13.6 g/dL (ref 12.0–15.0)
Lymphocytes Relative: 28 % (ref 12.0–46.0)
Lymphs Abs: 2.4 10*3/uL (ref 0.7–4.0)
MCHC: 30 g/dL (ref 30.0–36.0)
MCV: 71.1 fL — ABNORMAL LOW (ref 78.0–100.0)
Monocytes Absolute: 0.6 10*3/uL (ref 0.1–1.0)
Monocytes Relative: 7.1 % (ref 3.0–12.0)
Neutro Abs: 5.3 10*3/uL (ref 1.4–7.7)
Neutrophils Relative %: 61.9 % (ref 43.0–77.0)
Platelets: 309 10*3/uL (ref 150.0–400.0)
RBC: 6.39 Mil/uL — ABNORMAL HIGH (ref 3.87–5.11)
RDW: 15.5 % (ref 11.5–15.5)
WBC: 8.6 10*3/uL (ref 4.0–10.5)

## 2022-12-10 LAB — HEMOGLOBIN A1C: Hgb A1c MFr Bld: 6.1 % (ref 4.6–6.5)

## 2022-12-10 LAB — BASIC METABOLIC PANEL
BUN: 8 mg/dL (ref 6–23)
CO2: 25 meq/L (ref 19–32)
Calcium: 9.4 mg/dL (ref 8.4–10.5)
Chloride: 106 meq/L (ref 96–112)
Creatinine, Ser: 0.79 mg/dL (ref 0.40–1.20)
GFR: 89.82 mL/min (ref >60.00)
Glucose, Bld: 87 mg/dL (ref 70–99)
Potassium: 3.9 meq/L (ref 3.5–5.1)
Sodium: 139 meq/L (ref 135–145)

## 2022-12-10 LAB — LIPID PANEL
Cholesterol: 125 mg/dL (ref 0–200)
HDL: 46.5 mg/dL (ref >39.00)
LDL Cholesterol: 59 mg/dL (ref 0–99)
NonHDL: 78.11
Total CHOL/HDL Ratio: 3
Triglycerides: 98 mg/dL (ref 0.0–149.0)
VLDL: 19.6 mg/dL (ref 0.0–40.0)

## 2022-12-10 LAB — T4, FREE: Free T4: 0.61 ng/dL (ref 0.60–1.60)

## 2022-12-10 LAB — HEPATIC FUNCTION PANEL
ALT: 12 U/L (ref 0–35)
AST: 12 U/L (ref 0–37)
Albumin: 4.1 g/dL (ref 3.5–5.2)
Alkaline Phosphatase: 54 U/L (ref 39–117)
Bilirubin, Direct: 0.1 mg/dL (ref 0.0–0.3)
Total Bilirubin: 0.3 mg/dL (ref 0.2–1.2)
Total Protein: 7.8 g/dL (ref 6.0–8.3)

## 2022-12-10 LAB — TSH: TSH: 0.85 u[IU]/mL (ref 0.35–5.50)

## 2022-12-10 LAB — BRAIN NATRIURETIC PEPTIDE: Pro B Natriuretic peptide (BNP): 7 pg/mL (ref 0.0–100.0)

## 2022-12-10 NOTE — Progress Notes (Signed)
Chief Complaint  Patient presents with   Weight Loss    Pt states she would like to discuss on Zepbound. Pt added she noticed experiencing when walking.    Hemorrhoids    Was seen with Dr. [place]. Prescribed suppository, didn't pick it up due to cost. Pt states sx starts to flares up again. Would like a Rx for it.     HPI: Kristin Harper 47 y.o. come in for Chronic disease management  couple of issues   Weight  problems   for a while but now has gained 20 pounds  more recnetly  ? If related to job change or other   interested in zepbound. Friends have had success with this .Step  monitoring    meal prep. Not successful  Job switch     may be eating more.   Has pcos at risk dm and  PT cerebri that may be helped by weight control. Also had OSA and nasal s/throat surgery in past   to help with sleep.   Hemorrh given hcs rectal  10 5  too expensive  sx off and on    may end up using  otc . Doesn't have a appt with gi yet but was contacted in my chart . She has not had any colon cancer screen   Has to get a new  opthal for her Pseudotumor.Marland Kitchen  gets ha if goes off med  Has noted some doe since gaining weight but no events   Had eval ed last year   neg cardiac? Echo  ROS: See pertinent positives and negatives per HPI.  Past Medical History:  Diagnosis Date   Abnormal Pap smear    Over 10 years ago per pt, pre cancerous cells were removed.    Acid reflux    Anemia    Fibroid    Hx of varicella    as child   PCOS (polycystic ovarian syndrome) 09/26/2020   Premenopausal patient 08/05/2017   Pseudotumor cerebri    takes diamox for, monitored by dr Casimiro Needle spencer md   Syncope    Uterine fibroid 09/26/2020    Family History  Problem Relation Age of Onset   Heart attack Maternal Aunt    Hypertension Mother    Other Father        shot and killed age 66    Social History   Socioeconomic History   Marital status: Single    Spouse name: Not on file   Number of children: Not on  file   Years of education: Not on file   Highest education level: Not on file  Occupational History   Not on file  Tobacco Use   Smoking status: Never   Smokeless tobacco: Never  Vaping Use   Vaping status: Never Used  Substance and Sexual Activity   Alcohol use: Not Currently   Drug use: No   Sexual activity: Not Currently    Partners: Male  Other Topics Concern   Not on file  Social History Narrative   g 1 p 0    No tobacco    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 Is diamox er per pt bridge therapy. 30 capsule 0   HYDROCORTISONE ACE, RECTAL, 30 MG SUPP Place 1 suppository (30 mg total) rectally 2 (two) times daily. (Patient not taking: Reported on 12/10/2022) 28 suppository 0   cetirizine (ZYRTEC) 10 MG tablet Take 1 tablet (10 mg total) by mouth daily. (Patient not taking: Reported on 12/10/2022) 30 tablet 0   fluticasone (FLONASE) 50 MCG/ACT nasal spray Place 1 spray into both nostrils daily. (Patient not taking: Reported on 12/10/2022) 16 g 0   ketoconazole (NIZORAL) 2 % cream Apply 1 Application topically 2 (two) times daily. To rash (Patient not taking: Reported on 12/10/2022) 30 g 1   ciprofloxacin-dexamethasone (CIPRODEX) OTIC suspension Place 4 drops into the left ear 2 (two) times daily. (Patient not taking: Reported on 12/10/2022) 7.5 mL 0   No facility-administered medications prior to visit.     EXAM:  BP 126/89 (BP Location: Left Arm, Patient Position: Sitting, Cuff Size: Large)   Pulse (!) 111   Temp 98.9 F (37.2 C) (Oral)   Ht 5\' 6"  (1.676 m)   Wt (!) 311 lb 12.8 oz  (141.4 kg)   LMP 09/10/2020   SpO2 97%   BMI 50.33 kg/m   Body mass index is 50.33 kg/m.  GENERAL: vitals reviewed and listed above, alert, oriented, appears well hydrated and in no acute distress HEENT: atraumatic, conjunctiva  clear, no obvious abnormalities on inspection of external nose and ears NECK: no obvious masses on inspection palpation  LUNGS: clear to auscultation bilaterally, no wheezes, rales or rhonchi, good air movement CV: HRRR, no clubbing cyanosis or  peripheral edema nl cap refill  MS: moves all extremities without noticeable focal  abnormality PSYCH: pleasant and cooperative, no obvious depression or anxiety Lab Results  Component Value Date   WBC 8.6 12/10/2022   HGB 13.6 12/10/2022   HCT 45.4 12/10/2022   PLT 309.0 12/10/2022   GLUCOSE 87 12/10/2022   CHOL 125 12/10/2022   TRIG 98.0 12/10/2022   HDL 46.50 12/10/2022   LDLCALC 59 12/10/2022   ALT 12 12/10/2022   AST 12 12/10/2022   NA 139 12/10/2022   K 3.9 12/10/2022   CL 106 12/10/2022   CREATININE 0.79 12/10/2022   BUN 8 12/10/2022   CO2 25 12/10/2022   TSH 0.85 12/10/2022   HGBA1C 6.1 12/10/2022   BP Readings from Last 3 Encounters:  12/10/22 126/89  11/22/22 128/80  08/19/22 122/84    ASSESSMENT AND PLAN:  Discussed the following assessment and plan:  POLYCYSTIC OVARIAN DISEASE - Plan: Basic metabolic panel, CBC with Differential/Platelet, Hemoglobin A1c, Hepatic function panel, Lipid panel, TSH, T4, free, Brain Natriuretic Peptide  Medication management - Plan: Basic metabolic panel, CBC with Differential/Platelet, Hemoglobin A1c, Hepatic function panel, Lipid panel, TSH, T4, free  Pseudotumor cerebri - Plan: Basic metabolic panel, CBC with Differential/Platelet, Hemoglobin A1c, Hepatic function panel, Lipid panel, TSH, T4, free, Brain Natriuretic Peptide  Hyperglycemia - Plan: Basic metabolic panel, CBC with Differential/Platelet, Hemoglobin A1c, Hepatic function panel, Lipid panel,  TSH, T4, free  Sleep disorder breathing - Plan: Basic metabolic panel, CBC with Differential/Platelet, Hemoglobin A1c, Hepatic function panel, Lipid panel, TSH, T4, free  Obesity, morbid, BMI 50 or higher (HCC) - Plan: Basic metabolic panel, CBC with Differential/Platelet, Hemoglobin A1c, Hepatic function panel, Lipid panel, TSH, T4, free  Dyspnea, unspecified type - Plan: Brain Natriuretic Peptide  Rectal bleeding - hx  perianal  encourged Gi appt  as discussed Over all a good candidate for glp1 or glp/gip for  high risk reasons  .   More than metabolic  Get updated lab and then can order  but she may need to check with insurance also . Today is post prandial  bo jangles  bacone cheese with  part of biscuit  and water    Expectant management. And attend to lsi in addition  as doscussed  -Patient advised to return or notify health care team  if  new concerns arise.  Patient Instructions  Still advise  see Gi about  sx and need for colon cancer  screening.   Can try anusol or other OTC  suppository  first .   Update   labs blood sugar .  Non fasting  I agree getting bmi down will be helpful .  Afer results can send in a rx for GLP 1 med and see if approved by insurance  Would do a fu  virtual ok in 1-2 mos after starting.   Optimize  lifestyle intervention healthy eating and activity .     Neta Mends. Danaly Bari M.D.

## 2022-12-10 NOTE — Patient Instructions (Addendum)
Still advise  see Gi about  sx and need for colon cancer  screening.   Can try anusol or other OTC  suppository  first .   Update   labs blood sugar .  Non fasting  I agree getting bmi down will be helpful .  Afer results can send in a rx for GLP 1 med and see if approved by insurance  Would do a fu  virtual ok in 1-2 mos after starting.   Optimize  lifestyle intervention healthy eating and activity .

## 2023-02-21 ENCOUNTER — Telehealth: Payer: Self-pay | Admitting: *Deleted

## 2023-02-21 NOTE — Telephone Encounter (Signed)
 Copied from CRM [id number]. Topic: Clinical - Medication Question >> Feb 21, 2023  1:28 PM Drema MATSU wrote: Reason for CRM: Patient states that some test were ran on her back in October and she was told that she was possibly going to get a prescription for Zepbound . Patient is calling to follow up to see if a request was submitted to insurance with her existing health condition.

## 2023-02-24 NOTE — Telephone Encounter (Signed)
 Patient informed of the message below and declined scheduling an appt.  Patient stated she preferred to have Dr Fabian Sharp review the results to decide if she is a candidate for the Rx.  Message sent to PCP.

## 2023-03-02 ENCOUNTER — Encounter: Payer: Self-pay | Admitting: Internal Medicine

## 2023-03-02 DIAGNOSIS — G932 Benign intracranial hypertension: Secondary | ICD-10-CM

## 2023-03-02 DIAGNOSIS — E282 Polycystic ovarian syndrome: Secondary | ICD-10-CM

## 2023-03-05 MED ORDER — ZEPBOUND 2.5 MG/0.5ML ~~LOC~~ SOAJ: 2.5000 mg | SUBCUTANEOUS | 0 refills | Status: DC

## 2023-03-05 NOTE — Telephone Encounter (Signed)
 Apologies for lack of now movement on this problem   Unfortunately  most insruance use criteria that they set   Kristin Harper does have a program  outside of insurance also but still a cost and dont know criteria .  Kristin Harper  However  We can send  order for zepbound   under dx of  pseudotumor cerebri which is  related to weight , /PCOS   dmetabolic syndrome and high BMI    Kristin Harper  please send in  2.5 weekly of zepbound  disp 1 month and see if approved    [email or web address]  [email or web address] 

## 2023-03-10 ENCOUNTER — Other Ambulatory Visit (HOSPITAL_COMMUNITY): Payer: Self-pay

## 2023-03-18 ENCOUNTER — Other Ambulatory Visit: Payer: Self-pay | Admitting: Family

## 2023-03-18 ENCOUNTER — Telehealth: Payer: Self-pay

## 2023-03-18 DIAGNOSIS — G932 Benign intracranial hypertension: Secondary | ICD-10-CM

## 2023-03-18 DIAGNOSIS — E282 Polycystic ovarian syndrome: Secondary | ICD-10-CM

## 2023-03-18 MED ORDER — ZEPBOUND 2.5 MG/0.5ML ~~LOC~~ SOAJ: 2.5000 mg | SUBCUTANEOUS | 0 refills | Status: DC

## 2023-03-18 NOTE — Telephone Encounter (Signed)
Copied from CRM [id number]. Topic: Clinical - Prescription Issue >> Mar 18, 2023 12:48 PM Sonny Dandy B wrote: Reason for CRM: pt called and request tirzepatide (ZEPBOUND) 2.5 MG/0.5ML Pen  medication to be sent to  [facility]. Npi [id number], NCPDP [id number]  phone number [phone] please call pt back at [phone]

## 2023-03-21 NOTE — Telephone Encounter (Signed)
Lab shows no diabetes  but  she does have medical condition that is aggravated by obesity   ( pseudotumor cerebri and polycystic ovary and obesity ) and I think she would benefit from appropriate weight loss   I do not know if insurance will cover for this  but is medically  appropriate to try.  Please  see  process  for poss zepbound an do a PA for   any program or pharmacy  .  I do not now the status of this request .

## 2023-03-24 ENCOUNTER — Other Ambulatory Visit: Payer: Self-pay | Admitting: Family

## 2023-03-24 DIAGNOSIS — E282 Polycystic ovarian syndrome: Secondary | ICD-10-CM

## 2023-03-24 DIAGNOSIS — G932 Benign intracranial hypertension: Secondary | ICD-10-CM

## 2023-03-24 NOTE — Telephone Encounter (Signed)
Attempted to contact patient to follow up on status of this request. Had to leave a voice message.

## 2023-03-24 NOTE — Telephone Encounter (Signed)
The patient need the missing information for her medication zepbound sent to  [facility]  she would also like a call back regarding this message the prescription is missing information

## 2023-03-24 NOTE — Telephone Encounter (Signed)
Patient is needing her zepbound medication information sent over to her pharmacy its missing some information

## 2023-03-27 MED ORDER — ZEPBOUND 2.5 MG/0.5ML ~~LOC~~ SOAJ: 2.5000 mg | SUBCUTANEOUS | 0 refills | Status: DC

## 2023-05-20 ENCOUNTER — Ambulatory Visit: Payer: 59 | Admitting: Neurology

## 2023-05-20 ENCOUNTER — Encounter: Payer: Self-pay | Admitting: Neurology

## 2023-05-20 DIAGNOSIS — G932 Benign intracranial hypertension: Secondary | ICD-10-CM | POA: Insufficient documentation

## 2023-05-20 DIAGNOSIS — E282 Polycystic ovarian syndrome: Secondary | ICD-10-CM | POA: Diagnosis not present

## 2023-05-20 MED ORDER — ACETAZOLAMIDE ER 500 MG PO CP12: 500.0000 mg | ORAL_CAPSULE | Freq: Every day | ORAL | 3 refills | Status: AC

## 2023-05-20 NOTE — Progress Notes (Signed)
 [facility]    Provider:  Dr Lucia Gaskins Requesting Provider: Elise Benne, MD Primary Care Provider:  Madelin Headings, MD  CC:  IDIOPATHIC INTRACRANIAL HYPERTENSION   HPI:  Kristin Harper is a 48 y.o. female here as requested by Elise Benne, MD for IDIOPATHIC INTRACRANIAL HYPERTENSION. has POLYCYSTIC OVARIAN DISEASE; ELEVATED BLOOD PRESSURE; History of recurrent UTIs; Syncope and collapse; Orthostatic hypotension; Pseudotumor cerebri; and Obesity, morbid, BMI 50 or higher (HCC) on their problem list. She has history of iih and saw Dr. Everlena Cooper in neurology in 2016.  I reviewed Dr. Moises Blood notes, from 5 years ago, she presented for idiopathic intracranial hypertension with symptoms starting approximately her prior she noticed easy vision in the right eye, on ophthalmologic exam she was found to have papilledema.  MRI of the brain with and without contrast was performed February 2015 which showed mild cerebellar tonsillar ectopia but overall unremarkable, she reportedly had a lumbar puncture with revealed an elevated opening pressure, she had occasional mild headaches but nothing significant, she denied pulsatile tinnitus, she was started on acetazolamide ER 500 mg, however she experienced significant paresthesias on twice daily dosing so she was reduced to once daily dosing, the paresthesias improved, symptoms were better if she kept hydrated, she had eye evaluations over the prior year which revealed no papilledema.  Recent evaluation from [facility] ophthalmology shows no papilledema bilaterally.  Difficult to read handwritten notes but clearly says no papilledema OS and OD. She has sleep apnea and pcos. She just started zepbound. She is doing well. No visin changes, no headaches, no ringin gin the ears or whooshing int he ears, had recent ophthalmology exam with no papilledema, when she lose weight we can slowly decrease the acetazolamide and I think zepbound can help. No symptoms of  sleep apnea she was tested in the past. Recommend the healthy weight and wellness. No other focal neurologic deficits, associated symptoms, inciting events or modifiable factors.   03/22/2013: Narrative  CLINICAL DATA:  Papilledema, hazy vision in right eye  EXAM: MRI HEAD WITHOUT AND WITH CONTRAST  TECHNIQUE: Multiplanar, multiecho pulse sequences of the brain and surrounding structures were obtained without and with intravenous contrast.  CONTRAST:  20mL MULTIHANCE GADOBENATE DIMEGLUMINE 529 MG/ML IV SOLN  COMPARISON:  None available  FINDINGS: Cerebral volume is within normal limits for patient age. No focal parenchymal signal abnormality is identified. No a T2/FLAIR signal abnormality are seen within the periventricular white matter or elsewhere to suggest demyelinating disease. No mass lesion, mass effects, or midline shift. Ventricles are normal in size without evidence of hydrocephalus. There is no extra-axial fluid collection.  No diffusion-weighted signal abnormality identified to suggest acute intracranial infarct. Normal flow voids are seen within the intracranial vasculature. Gray-white matter differentiation is maintained. No acute intracranial hemorrhage.  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 No frank Chiari malformation. Cervicomedullary junction is otherwise normal. Pituitary gland is within normal limits. No evidence empty sella. The globes and optic nerves demonstrate a normal appearance with normal signal intensity.  No abnormal enhancement identified on post-contrast sequences.  Bone marrow signal intensity is within normal limits. The visualized upper cervical spine is unremarkable.  Probable retention cyst noted within the bilateral maxillary sinuses. Paranasal sinuses are otherwise clear. No mastoid effusion.  IMPRESSION: 1. No acute infarct or other intracranial abnormality. 2. 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 3. Otherwise unremarkable contrast-enhanced MRI of the brain with no findings to explain papilledema, visual disturbance.  CBC    Component Value Date/Time   WBC 8.6 12/10/2022 1609   RBC 6.39 (H) 12/10/2022 1609   HGB 13.6 12/10/2022 1609   HCT 45.4 12/10/2022 1609   PLT 309.0 12/10/2022 1609   MCV 71.1 (L) 12/10/2022 1609   MCH 21.0 (L) 09/16/2021 2301   MCHC 30.0 12/10/2022 1609   RDW 15.5 12/10/2022 1609   LYMPHSABS 2.4 12/10/2022 1609   MONOABS 0.6 12/10/2022 1609   EOSABS 0.2 12/10/2022 1609   BASOSABS 0.1 12/10/2022 1609   CMP     Component Value Date/Time   NA 139 12/10/2022 1609   K 3.9 12/10/2022 1609   CL 106 12/10/2022 1609   CO2 25 12/10/2022 1609   GLUCOSE 87 12/10/2022 1609   BUN 8 12/10/2022 1609   CREATININE 0.79 12/10/2022 1609   CALCIUM 9.4 12/10/2022 1609   PROT 7.8 12/10/2022 1609   ALBUMIN 4.1 12/10/2022 1609   AST 12 12/10/2022 1609   ALT 12 12/10/2022 1609   ALKPHOS 54 12/10/2022 1609   BILITOT 0.3 12/10/2022 1609   GFR 89.82 12/10/2022 1609   GFRNONAA >60 09/16/2021 2301      Reviewed notes, labs and imaging from outside physicians, which showed: see above  Review of Systems: Patient complains of symptoms per HPI as well as the following symptoms obesity. Pertinent negatives and positives per HPI. All others negative.   Social History   Socioeconomic History   Marital status: Single    Spouse name: Not on file   Number of children: Not on file   Years of education: Not on file   Highest education level: Not on file  Occupational History   Not on file  Tobacco Use   Smoking status: Never   Smokeless tobacco: Never  Vaping Use   Vaping status: Never Used  Substance and Sexual Activity   Alcohol use: Not Currently   Drug use: No   Sexual activity: Not Currently    Partners: Male  Other Topics Concern   Not on file  Social History Narrative   g 1 p 0    No tobacco    internet based  business   Works at [employer] Strain: Not on [employer] Insecurity: Not on file  Transportation Needs: Not on file  Physical Activity: Not on file  Stress: Not on file  Social Connections: Not on file  Intimate Partner Violence: Not on file    Family History  Problem Relation Age of Onset   Heart attack Maternal Aunt    Hypertension Mother    Other Father        shot and killed age 49    Past Medical History:  Diagnosis Date   Abnormal Pap smear    Over 10 years ago per pt, pre cancerous cells were removed.    Acid reflux    Anemia    Fibroid    Hx of varicella    as child   PCOS (polycystic ovarian syndrome) 09/26/2020   Premenopausal patient 08/05/2017   Pseudotumor cerebri    takes diamox for, monitored by dr Casimiro Needle spencer md   Syncope    Uterine fibroid 09/26/2020    Patient Active Problem List   Diagnosis Date Noted   Pseudotumor cerebri 05/20/2023   Obesity, morbid, BMI 50 or higher (HCC) 05/20/2023   Syncope and collapse 12/10/2021   Orthostatic hypotension 12/10/2021   History of recurrent UTIs 01/09/2012  POLYCYSTIC OVARIAN DISEASE 09/11/2007   ELEVATED BLOOD PRESSURE 09/11/2007    Past Surgical History:  Procedure Laterality Date   CYSTOSCOPY  10/05/2020   Procedure: CYSTOSCOPY;  Surgeon: Hoover Browns, MD;  Location: [facility];  Service: Gynecology;;   DILATION AND CURETTAGE OF UTERUS  02/19/2003   In [place]   NASAL SEPTUM SURGERY  02/18/2002   TONSILLECTOMY     2004, turbinate reduction septal reconstruction also   TOTAL LAPAROSCOPIC HYSTERECTOMY WITH SALPINGECTOMY Bilateral 10/05/2020   Procedure: TOTAL LAPAROSCOPIC HYSTERECTOMY WITH SALPINGECTOMY;  Surgeon: Hoover Browns, MD;  Location: [facility];  Service: Gynecology;  Laterality: Bilateral;   TURBINATE RESECTION  02/18/2002    Current Outpatient Medications  Medication Sig Dispense Refill   tirzepatide (ZEPBOUND) 2.5  MG/0.5ML Pen Inject 2.5 mg into the skin once a week. Inject 2.5mg  (0.31mL) into the skin once weekly. E66.0, E28.2 6 mL 0   acetaZOLAMIDE ER (DIAMOX) 500 MG capsule Take 1 capsule (500 mg total) by mouth daily. Is diamox er per pt bridge therapy. 90 capsule 3   No current facility-administered medications for this visit.    Allergies as of 05/20/2023 - Review Complete 05/20/2023  Allergen Reaction Noted   Penicillins Hives and Rash 09/11/2007    Vitals: BP (!) 146/86   Pulse 78   Ht 5\' 6"  (1.676 m)   Wt (!) 306 lb 9.6 oz (139.1 kg)   LMP 09/10/2020   BMI 49.49 kg/m  Last Weight:  Wt Readings from Last 1 Encounters:  05/20/23 (!) 306 lb 9.6 oz (139.1 kg)   Last Height:   Ht Readings from Last 1 Encounters:  05/20/23 5\' 6"  (1.676 m)     Physical exam: Exam: Gen: NAD, conversant, well nourised, obese, well groomed                     CV: RRR, no MRG. No Carotid Bruits. No peripheral edema, warm, nontender Eyes: Conjunctivae clear without exudates or hemorrhage  Neuro: Detailed Neurologic Exam  Speech:    Speech is normal; fluent and spontaneous with normal comprehension.  Cognition:    The patient is oriented to person, place, and time;     recent and remote memory intact;     language fluent;     normal attention, concentration,     fund of knowledge Cranial Nerves:    The pupils are equal, round, and reactive to light. The fundi are normal and spontaneous venous pulsations are present. Visual fields are full to finger confrontation. Extraocular movements are intact. Trigeminal sensation is intact and the muscles of mastication are normal. The face is symmetric. The palate elevates in the midline. Hearing intact. Voice is normal. Shoulder shrug is normal. The tongue has normal motion without fasciculations.   Coordination: nml  Gait: nml  Motor Observation:    No asymmetry, no atrophy, and no involuntary movements noted. Tone:    Normal muscle tone.     Posture:    Posture is normal. normal erect    Strength:    Strength is V/V in the upper and lower limbs.      Sensation: intact to LT     Reflex Exam:  DTR's:    Deep tendon reflexes in the upper and lower extremities are normal bilaterally.   Toes:    The toes are downgoing bilaterally.   Clonus:    Clonus is absent.    Assessment/Plan:  Patient with long-standing IDIOPATHIC INTRACRANIAL HYPERTENSION on diamox  since 2017  - Recent evaluation from [facility] ophthalmology shows no papilledema bilaterally.  Difficult to read handwritten notes but clearly says no papilledema OS and OD.  - She just started zepbound. She is doing well. No vision changes, no headaches, no ringin gin the ears or whooshing int he ears, had recent ophthalmology exam with no papilledema,  - when she lose weight we can slowly decrease the acetazolamide and I think zepbound can help.  - No current symptoms of sleep apnea she was tested in the past and had serugery to remove tonsils and other tissue and turbinate reduction and septal reconstruction. - Recommend the healthy weight and wellness. She can call and schedule - patient can mychart if lost 10-20% of body weight and we can give her 250mg  tabs 2 daily and ask her to decrease to 250 daily but go back up if needed(she cannot split ER) - Continue Zepbound   No orders of the defined types were placed in this encounter.  Meds ordered this encounter  Medications   acetaZOLAMIDE ER (DIAMOX) 500 MG capsule    Sig: Take 1 capsule (500 mg total) by mouth daily. Is diamox er per pt bridge therapy.    Dispense:  90 capsule    Refill:  3    Cc: Elise Benne, MD,  Panosh, Neta Mends, MD  Naomie Dean, MD  [facility] [address] [place], [place] [address]  Phone [phone] Fax [phone]

## 2023-05-20 NOTE — Addendum Note (Signed)
 Addended by: Naomie Dean B on: 05/20/2023 09:10 AM   Modules accepted: Level of Service

## 2023-05-30 ENCOUNTER — Telehealth: Payer: Self-pay

## 2023-05-30 MED ORDER — TIRZEPATIDE-WEIGHT MANAGEMENT 5 MG/0.5ML ~~LOC~~ SOLN: 5.0000 mg | SUBCUTANEOUS | 0 refills | Status: AC

## 2023-05-30 NOTE — Telephone Encounter (Signed)
-----   Message from Berniece Andreas sent at 05/30/2023  7:13 AM EDT ----- Regarding: RE: Zepbound The pharmacy is  [facility]    and I think you should be able to  have a clinical pharmacist through [facility] help with directing program with your patients.   Delano Metz is our pharmacist at [facility] and her efforts have been very helpful.      Things are changing so much I don't know what will be next.    Thanks for the feed back W. ----- Message ----- From: Anson Fret, MD Sent: 05/20/2023   8:24 AM EDT To: Madelin Headings, MD Subject: Zepbound                                       Dr. Fabian Sharp, We have a lovely mutual patient who has IDIOPATHIC INTRACRANIAL HYPERTENSION and pcos. It was great that you all got her zepbound (paying out of pocket). Anyway your team can increase the dose to 5? I'd do it but I have no idea where to send it sorry and I hear your team sent it to the right place, she is paying out of pocket. THANK YOU for doing this !!! I wish all my IDIOPATHIC INTRACRANIAL HYPERTENSION patients could have this! Artemio Aly

## 2023-05-30 NOTE — Progress Notes (Signed)
   05/30/2023  Patient ID: Kristin Harper, female   DOB: 06-29-1975, 48 y.o.   MRN: [id number]  Received request from PCP/Neuro to increase patient's zepbound to 5mg . Sending in order to the required [facility].  Sherrill Raring, PharmD Clinical Pharmacist [phone]

## 2023-06-01 ENCOUNTER — Ambulatory Visit
Admission: EM | Admit: 2023-06-01 | Discharge: 2023-06-01 | Disposition: A | Discharge status: Home / Self Care | Attending: Physician Assistant | Admitting: Physician Assistant

## 2023-06-01 DIAGNOSIS — N3001 Acute cystitis with hematuria: Secondary | ICD-10-CM | POA: Insufficient documentation

## 2023-06-01 LAB — POCT URINALYSIS DIP (MANUAL ENTRY)
Bilirubin, UA: NEGATIVE
Glucose, UA: NEGATIVE mg/dL
Ketones, POC UA: NEGATIVE mg/dL
Nitrite, UA: NEGATIVE
Protein Ur, POC: 30 mg/dL — AB
Spec Grav, UA: 1.015 (ref 1.010–1.025)
Urobilinogen, UA: 0.2 U/dL
pH, UA: 7 (ref 5.0–8.0)

## 2023-06-01 MED ORDER — NITROFURANTOIN MONOHYD MACRO 100 MG PO CAPS: 100.0000 mg | ORAL_CAPSULE | Freq: Two times a day (BID) | ORAL | 0 refills | Status: AC

## 2023-06-01 NOTE — ED Provider Notes (Signed)
 [name]    CSN: [id number] Arrival date & time: 06/01/23  1016      History   Chief Complaint Chief Complaint  Patient presents with   UTI Symptoms    HPI Kristin Harper is a 48 y.o. female.   Patient here today for possible UTI.  She reports that she has been seeing blood in her urine and has had some pressure and pain towards the end of voiding.  She denies any fever.  She has not any belly pain or back pain.  She notes symptoms started 2 days ago.  She denies vaginal discharge.  The history is provided by the patient.    Past Medical History:  Diagnosis Date   Abnormal Pap smear    Over 10 years ago per pt, pre cancerous cells were removed.    Acid reflux    Anemia    Fibroid    Hx of varicella    as child   PCOS (polycystic ovarian syndrome) 09/26/2020   Premenopausal patient 08/05/2017   Pseudotumor cerebri    takes diamox for, monitored by dr Bambi Lever spencer md   Syncope    Uterine fibroid 09/26/2020    Patient Active Problem List   Diagnosis Date Noted   Pseudotumor cerebri 05/20/2023   Obesity, morbid, BMI 50 or higher (HCC) 05/20/2023   Syncope and collapse 12/10/2021   Orthostatic hypotension 12/10/2021   History of recurrent UTIs 01/09/2012   POLYCYSTIC OVARIAN DISEASE 09/11/2007   ELEVATED BLOOD PRESSURE 09/11/2007    Past Surgical History:  Procedure Laterality Date   CYSTOSCOPY  10/05/2020   Procedure: CYSTOSCOPY;  Surgeon: Vernal Gold, MD;  Location: [facility];  Service: Gynecology;;   DILATION AND CURETTAGE OF UTERUS  02/19/2003   In [place]   NASAL SEPTUM SURGERY  02/18/2002   TONSILLECTOMY     2004, turbinate reduction septal reconstruction also   TOTAL LAPAROSCOPIC HYSTERECTOMY WITH SALPINGECTOMY Bilateral 10/05/2020   Procedure: TOTAL LAPAROSCOPIC HYSTERECTOMY WITH SALPINGECTOMY;  Surgeon: Vernal Gold, MD;  Location: [facility];  Service: Gynecology;  Laterality: Bilateral;   TURBINATE  RESECTION  02/18/2002    OB History     Gravida  1   Para      Term      Preterm      AB  1   Living  0      SAB  1   IAB      Ectopic      Multiple      Live Births               Home Medications    Prior to Admission medications   Medication Sig Start Date End Date Taking? Authorizing Provider  acetaZOLAMIDE ER (DIAMOX) 500 MG capsule Take 1 capsule (500 mg total) by mouth daily. Is diamox er per pt bridge therapy. 05/20/23  Yes Glory Larsen, MD  nitrofurantoin, macrocrystal-monohydrate, (MACROBID) 100 MG capsule Take 1 capsule (100 mg total) by mouth 2 (two) times daily. 06/01/23  Yes Vernestine Gondola, PA-C  tirzepatide 5 MG/0.5ML injection vial Inject 5 mg into the skin once a week. Inject 5mg  (0.5mL) into the skin once weekly 05/30/23  Yes Panosh, Wanda K, MD    Family History Family History  Problem Relation Age of Onset   Hypertension Mother    Other Father        shot and killed age 36   Heart attack Maternal Aunt  Social History Social History   Tobacco Use   Smoking status: Never   Smokeless tobacco: Never  Vaping Use   Vaping status: Never Used  Substance Use Topics   Alcohol use: Yes    Comment: Socially.   Drug use: No     Allergies   Penicillins   Review of Systems Review of Systems  Constitutional:  Negative for chills and fever.  Eyes:  Negative for discharge and redness.  Respiratory:  Negative for shortness of breath.   Gastrointestinal:  Negative for abdominal pain, nausea and vomiting.  Genitourinary:  Positive for dysuria and hematuria. Negative for vaginal discharge.     Physical Exam Triage Vital Signs ED Triage Vitals  Encounter Vitals Group     BP 06/01/23 1024 122/75     Systolic BP Percentile --      Diastolic BP Percentile --      Pulse Rate 06/01/23 1024 89     Resp 06/01/23 1024 20     Temp 06/01/23 1024 97.9 F (36.6 C)     Temp Source 06/01/23 1024 Oral     SpO2 06/01/23 1024 96 %      Weight 06/01/23 1022 299 lb (135.6 kg)     Height 06/01/23 1022 5\' 6"  (1.676 m)     Head Circumference --      Peak Flow --      Pain Score 06/01/23 1021 0     Pain Loc --      Pain Education --      Exclude from Growth Chart --    No data found.  Updated Vital Signs BP 122/75 (BP Location: Left Arm)   Pulse 89   Temp 97.9 F (36.6 C) (Oral)   Resp 20   Ht 5\' 6"  (1.676 m)   Wt 299 lb (135.6 kg)   LMP 09/10/2020   SpO2 96%   BMI 48.26 kg/m   Visual Acuity Right Eye Distance:   Left Eye Distance:   Bilateral Distance:    Right Eye Near:   Left Eye Near:    Bilateral Near:     Physical Exam Vitals and nursing note reviewed.  Constitutional:      General: She is not in acute distress.    Appearance: Normal appearance. She is not ill-appearing.  HENT:     Head: Normocephalic and atraumatic.  Eyes:     Conjunctiva/sclera: Conjunctivae normal.  Cardiovascular:     Rate and Rhythm: Normal rate.  Pulmonary:     Effort: Pulmonary effort is normal. No respiratory distress.  Neurological:     Mental Status: She is alert.  Psychiatric:        Mood and Affect: Mood normal.        Behavior: Behavior normal.        Thought Content: Thought content normal.      UC Treatments / Results  Labs (all labs ordered are listed, but only abnormal results are displayed) Labs Reviewed  POCT URINALYSIS DIP (MANUAL ENTRY) - Abnormal; Notable for the following components:      Result Value   Clarity, UA cloudy (*)    Blood, UA large (*)    Protein Ur, POC =30 (*)    Leukocytes, UA Trace (*)    All other components within normal limits  URINE CULTURE    EKG   Radiology No results found.  Procedures Procedures (including critical care time)  Medications Ordered in UC Medications - No data to display  Initial Impression / Assessment and Plan / UC Course  I have reviewed the triage vital signs and the nursing notes.  Pertinent labs & imaging results that were  available during my care of the patient were reviewed by me and considered in my medical decision making (see chart for details).    Suspect likely UTI and will treat with Macrobid.  Urine culture ordered.  Encouraged follow-up if no gradual improvement or with any worsening symptoms.  Final Clinical Impressions(s) / UC Diagnoses   Final diagnoses:  Acute cystitis with hematuria   Discharge Instructions   None    ED Prescriptions     Medication Sig Dispense Auth. Provider   nitrofurantoin, macrocrystal-monohydrate, (MACROBID) 100 MG capsule Take 1 capsule (100 mg total) by mouth 2 (two) times daily. 10 capsule Vernestine Gondola, PA-C      PDMP not reviewed this encounter.   Vernestine Gondola, PA-C 06/01/23 1445

## 2023-06-01 NOTE — ED Triage Notes (Signed)
"  I believe I have a UTI, I am seeing some blood in urine and pressure/pain at the end of voiding". No fever. Symptoms started on "Friday".

## 2023-06-03 LAB — URINE CULTURE: Culture: >=100000 — AB

## 2023-06-09 ENCOUNTER — Telehealth: Payer: Self-pay

## 2023-06-09 NOTE — Telephone Encounter (Signed)
 Incoming call/information:   mrn [id number] Kristin Harper was seen 4/13 and is asking if we can refill her nitrofurantoin . [phone]   Outgoing call/information:  Per provider in clinic Rebbeca Campi, NP), No refill of this medication, she will need to be re-evaluated and/or follow up with PCP.  Patient advised.  Maude Sorrel CMA

## 2023-11-27 ENCOUNTER — Telehealth: Payer: Self-pay

## 2023-11-27 NOTE — Telephone Encounter (Signed)
 Called and left voicemail for patient to reschedule appointment on 05/19/24 with Dr Ines.  If patient calls back, they can be rescheduled with Dr Onita

## 2024-05-19 ENCOUNTER — Telehealth: Admitting: Neurology
